# Patient Record
Sex: Male | Born: 1975 | Race: White | Hispanic: No | Marital: Single | Smoking: Former smoker
Health system: Southern US, Community
[De-identification: ages and names within clinical notes are randomized; demographics above are authoritative.]

## PROBLEM LIST (undated history)

## (undated) DIAGNOSIS — F32A Depression, unspecified: Secondary | ICD-10-CM

## (undated) DIAGNOSIS — T148XXA Other injury of unspecified body region, initial encounter: Secondary | ICD-10-CM

## (undated) DIAGNOSIS — Q780 Osteogenesis imperfecta: Secondary | ICD-10-CM

## (undated) DIAGNOSIS — F29 Unspecified psychosis not due to a substance or known physiological condition: Secondary | ICD-10-CM

## (undated) DIAGNOSIS — K279 Peptic ulcer, site unspecified, unspecified as acute or chronic, without hemorrhage or perforation: Secondary | ICD-10-CM

## (undated) DIAGNOSIS — K449 Diaphragmatic hernia without obstruction or gangrene: Secondary | ICD-10-CM

## (undated) DIAGNOSIS — D509 Iron deficiency anemia, unspecified: Secondary | ICD-10-CM

## (undated) DIAGNOSIS — I10 Essential (primary) hypertension: Secondary | ICD-10-CM

## (undated) DIAGNOSIS — K219 Gastro-esophageal reflux disease without esophagitis: Secondary | ICD-10-CM

## (undated) DIAGNOSIS — R152 Fecal urgency: Secondary | ICD-10-CM

## (undated) DIAGNOSIS — F845 Asperger's syndrome: Secondary | ICD-10-CM

## (undated) DIAGNOSIS — F329 Major depressive disorder, single episode, unspecified: Secondary | ICD-10-CM

## (undated) DIAGNOSIS — N39498 Other specified urinary incontinence: Secondary | ICD-10-CM

## (undated) DIAGNOSIS — R942 Abnormal results of pulmonary function studies: Secondary | ICD-10-CM

## (undated) HISTORY — DX: Fecal urgency: R15.2

## (undated) HISTORY — DX: Unspecified psychosis not due to a substance or known physiological condition: F29

## (undated) HISTORY — PX: DISTRACTION OSTEOGENESIS: SHX1473

## (undated) HISTORY — DX: Abnormal results of pulmonary function studies: R94.2

## (undated) HISTORY — DX: Diaphragmatic hernia without obstruction or gangrene: K44.9

## (undated) HISTORY — DX: Other injury of unspecified body region, initial encounter: T14.8XXA

## (undated) HISTORY — DX: Osteogenesis imperfecta: Q78.0

## (undated) HISTORY — DX: Depression, unspecified: F32.A

## (undated) HISTORY — DX: Gastro-esophageal reflux disease without esophagitis: K21.9

## (undated) HISTORY — DX: Asperger's syndrome: F84.5

## (undated) HISTORY — DX: Other specified urinary incontinence: N39.498

## (undated) HISTORY — DX: Peptic ulcer, site unspecified, unspecified as acute or chronic, without hemorrhage or perforation: K27.9

## (undated) HISTORY — DX: Major depressive disorder, single episode, unspecified: F32.9

## (undated) HISTORY — DX: Iron deficiency anemia, unspecified: D50.9

## (undated) HISTORY — DX: Essential (primary) hypertension: I10

---

## 2000-05-10 ENCOUNTER — Encounter: Payer: Self-pay | Admitting: Emergency Medicine

## 2000-05-10 ENCOUNTER — Emergency Department (HOSPITAL_COMMUNITY): Admission: EM | Admit: 2000-05-10 | Discharge: 2000-05-10 | Payer: Self-pay | Admitting: *Deleted

## 2000-06-09 ENCOUNTER — Emergency Department (HOSPITAL_COMMUNITY): Admission: EM | Admit: 2000-06-09 | Discharge: 2000-06-09 | Payer: Self-pay | Admitting: Emergency Medicine

## 2000-06-09 ENCOUNTER — Encounter: Payer: Self-pay | Admitting: Emergency Medicine

## 2000-08-06 ENCOUNTER — Emergency Department (HOSPITAL_COMMUNITY): Admission: EM | Admit: 2000-08-06 | Discharge: 2000-08-06 | Payer: Self-pay | Admitting: Emergency Medicine

## 2000-08-08 ENCOUNTER — Encounter: Payer: Self-pay | Admitting: Emergency Medicine

## 2000-08-08 ENCOUNTER — Emergency Department (HOSPITAL_COMMUNITY): Admission: EM | Admit: 2000-08-08 | Discharge: 2000-08-08 | Payer: Self-pay | Admitting: Emergency Medicine

## 2001-05-27 ENCOUNTER — Encounter: Payer: Self-pay | Admitting: Emergency Medicine

## 2001-05-27 ENCOUNTER — Emergency Department (HOSPITAL_COMMUNITY): Admission: EM | Admit: 2001-05-27 | Discharge: 2001-05-27 | Payer: Self-pay | Admitting: Emergency Medicine

## 2002-03-01 ENCOUNTER — Ambulatory Visit (HOSPITAL_COMMUNITY): Admission: RE | Admit: 2002-03-01 | Discharge: 2002-03-01 | Payer: Self-pay | Admitting: Internal Medicine

## 2002-03-01 ENCOUNTER — Encounter: Payer: Self-pay | Admitting: Internal Medicine

## 2002-06-13 ENCOUNTER — Encounter: Payer: Self-pay | Admitting: Specialist

## 2002-06-13 ENCOUNTER — Ambulatory Visit (HOSPITAL_COMMUNITY): Admission: RE | Admit: 2002-06-13 | Discharge: 2002-06-13 | Payer: Self-pay | Admitting: Specialist

## 2002-08-03 ENCOUNTER — Inpatient Hospital Stay (HOSPITAL_COMMUNITY): Admission: AD | Admit: 2002-08-03 | Discharge: 2002-08-07 | Payer: Self-pay | Admitting: Internal Medicine

## 2002-08-04 ENCOUNTER — Encounter: Payer: Self-pay | Admitting: Internal Medicine

## 2002-08-05 ENCOUNTER — Encounter: Payer: Self-pay | Admitting: *Deleted

## 2002-11-01 ENCOUNTER — Encounter: Admission: RE | Admit: 2002-11-01 | Discharge: 2002-11-01 | Payer: Self-pay | Admitting: Family Medicine

## 2002-11-29 ENCOUNTER — Encounter: Admission: RE | Admit: 2002-11-29 | Discharge: 2002-11-29 | Payer: Self-pay | Admitting: Family Medicine

## 2002-12-26 ENCOUNTER — Encounter: Admission: RE | Admit: 2002-12-26 | Discharge: 2002-12-26 | Payer: Self-pay | Admitting: Family Medicine

## 2003-01-23 ENCOUNTER — Encounter: Admission: RE | Admit: 2003-01-23 | Discharge: 2003-01-23 | Payer: Self-pay | Admitting: Family Medicine

## 2003-03-21 ENCOUNTER — Encounter: Payer: Self-pay | Admitting: Emergency Medicine

## 2003-03-21 ENCOUNTER — Emergency Department (HOSPITAL_COMMUNITY): Admission: EM | Admit: 2003-03-21 | Discharge: 2003-03-21 | Payer: Self-pay | Admitting: Emergency Medicine

## 2003-03-25 ENCOUNTER — Encounter: Admission: RE | Admit: 2003-03-25 | Discharge: 2003-03-25 | Payer: Self-pay | Admitting: Family Medicine

## 2003-10-18 ENCOUNTER — Encounter: Admission: RE | Admit: 2003-10-18 | Discharge: 2003-10-18 | Payer: Self-pay | Admitting: Sports Medicine

## 2003-11-01 ENCOUNTER — Inpatient Hospital Stay (HOSPITAL_COMMUNITY): Admission: EM | Admit: 2003-11-01 | Discharge: 2003-11-02 | Payer: Self-pay | Admitting: Emergency Medicine

## 2004-04-07 ENCOUNTER — Ambulatory Visit: Payer: Self-pay | Admitting: Family Medicine

## 2004-08-19 ENCOUNTER — Ambulatory Visit: Payer: Self-pay | Admitting: Family Medicine

## 2004-09-17 ENCOUNTER — Encounter: Admission: RE | Admit: 2004-09-17 | Discharge: 2004-09-17 | Payer: Self-pay | Admitting: Internal Medicine

## 2004-12-07 ENCOUNTER — Ambulatory Visit: Payer: Self-pay | Admitting: Family Medicine

## 2004-12-29 ENCOUNTER — Ambulatory Visit: Payer: Self-pay | Admitting: Sports Medicine

## 2005-01-18 ENCOUNTER — Ambulatory Visit: Payer: Self-pay | Admitting: Family Medicine

## 2005-07-16 ENCOUNTER — Ambulatory Visit: Payer: Self-pay | Admitting: Sports Medicine

## 2005-09-30 ENCOUNTER — Encounter: Admission: RE | Admit: 2005-09-30 | Discharge: 2005-12-29 | Payer: Self-pay | Admitting: Family Medicine

## 2005-10-29 ENCOUNTER — Ambulatory Visit: Payer: Self-pay | Admitting: Family Medicine

## 2005-11-04 ENCOUNTER — Ambulatory Visit: Payer: Self-pay | Admitting: Family Medicine

## 2006-02-28 ENCOUNTER — Ambulatory Visit: Payer: Self-pay | Admitting: Family Medicine

## 2006-04-08 ENCOUNTER — Ambulatory Visit: Payer: Self-pay | Admitting: Family Medicine

## 2006-07-22 ENCOUNTER — Ambulatory Visit: Payer: Self-pay | Admitting: Family Medicine

## 2006-08-05 ENCOUNTER — Ambulatory Visit: Payer: Self-pay | Admitting: Family Medicine

## 2006-08-05 ENCOUNTER — Encounter (INDEPENDENT_AMBULATORY_CARE_PROVIDER_SITE_OTHER): Payer: Self-pay | Admitting: Family Medicine

## 2006-08-05 LAB — CONVERTED CEMR LAB
ALT: 20 units/L (ref 0–53)
AST: 16 units/L (ref 0–37)
Alkaline Phosphatase: 103 units/L (ref 39–117)
Creatinine, Ser: 0.34 mg/dL — ABNORMAL LOW (ref 0.40–1.50)
Sodium: 141 meq/L (ref 135–145)
Total Bilirubin: 0.3 mg/dL (ref 0.3–1.2)
Total Protein: 7.2 g/dL (ref 6.0–8.3)

## 2006-08-19 ENCOUNTER — Ambulatory Visit: Payer: Self-pay | Admitting: Family Medicine

## 2006-09-01 DIAGNOSIS — I10 Essential (primary) hypertension: Secondary | ICD-10-CM

## 2006-09-01 DIAGNOSIS — L219 Seborrheic dermatitis, unspecified: Secondary | ICD-10-CM | POA: Insufficient documentation

## 2006-09-01 DIAGNOSIS — K279 Peptic ulcer, site unspecified, unspecified as acute or chronic, without hemorrhage or perforation: Secondary | ICD-10-CM

## 2006-09-01 DIAGNOSIS — J309 Allergic rhinitis, unspecified: Secondary | ICD-10-CM | POA: Insufficient documentation

## 2006-09-01 DIAGNOSIS — F339 Major depressive disorder, recurrent, unspecified: Secondary | ICD-10-CM | POA: Insufficient documentation

## 2006-09-01 DIAGNOSIS — F29 Unspecified psychosis not due to a substance or known physiological condition: Secondary | ICD-10-CM | POA: Insufficient documentation

## 2006-09-01 DIAGNOSIS — K219 Gastro-esophageal reflux disease without esophagitis: Secondary | ICD-10-CM | POA: Insufficient documentation

## 2006-09-01 HISTORY — DX: Allergic rhinitis, unspecified: J30.9

## 2006-09-08 ENCOUNTER — Ambulatory Visit: Payer: Self-pay | Admitting: Family Medicine

## 2006-09-08 ENCOUNTER — Telehealth (INDEPENDENT_AMBULATORY_CARE_PROVIDER_SITE_OTHER): Payer: Self-pay | Admitting: Family Medicine

## 2006-09-08 DIAGNOSIS — Q780 Osteogenesis imperfecta: Secondary | ICD-10-CM

## 2006-09-14 ENCOUNTER — Telehealth: Payer: Self-pay | Admitting: *Deleted

## 2006-09-15 ENCOUNTER — Ambulatory Visit: Payer: Self-pay | Admitting: Sports Medicine

## 2006-11-02 ENCOUNTER — Ambulatory Visit: Payer: Self-pay | Admitting: Family Medicine

## 2006-11-03 ENCOUNTER — Telehealth: Payer: Self-pay | Admitting: *Deleted

## 2006-11-04 ENCOUNTER — Ambulatory Visit: Payer: Self-pay | Admitting: Family Medicine

## 2006-11-04 ENCOUNTER — Encounter: Payer: Self-pay | Admitting: *Deleted

## 2006-11-08 ENCOUNTER — Telehealth (INDEPENDENT_AMBULATORY_CARE_PROVIDER_SITE_OTHER): Payer: Self-pay | Admitting: *Deleted

## 2006-11-10 ENCOUNTER — Telehealth (INDEPENDENT_AMBULATORY_CARE_PROVIDER_SITE_OTHER): Payer: Self-pay | Admitting: *Deleted

## 2007-01-27 ENCOUNTER — Telehealth: Payer: Self-pay | Admitting: *Deleted

## 2007-03-07 ENCOUNTER — Telehealth (INDEPENDENT_AMBULATORY_CARE_PROVIDER_SITE_OTHER): Payer: Self-pay | Admitting: Family Medicine

## 2007-03-16 ENCOUNTER — Ambulatory Visit: Payer: Self-pay | Admitting: Family Medicine

## 2007-03-16 DIAGNOSIS — H9190 Unspecified hearing loss, unspecified ear: Secondary | ICD-10-CM | POA: Insufficient documentation

## 2007-03-17 ENCOUNTER — Ambulatory Visit: Payer: Self-pay | Admitting: Family Medicine

## 2007-03-17 ENCOUNTER — Telehealth (INDEPENDENT_AMBULATORY_CARE_PROVIDER_SITE_OTHER): Payer: Self-pay | Admitting: Family Medicine

## 2007-03-23 ENCOUNTER — Telehealth: Payer: Self-pay | Admitting: *Deleted

## 2007-03-24 ENCOUNTER — Ambulatory Visit (HOSPITAL_COMMUNITY): Admission: RE | Admit: 2007-03-24 | Discharge: 2007-03-24 | Payer: Self-pay | Admitting: Family Medicine

## 2007-03-24 ENCOUNTER — Encounter (INDEPENDENT_AMBULATORY_CARE_PROVIDER_SITE_OTHER): Payer: Self-pay | Admitting: Family Medicine

## 2007-03-29 ENCOUNTER — Encounter (INDEPENDENT_AMBULATORY_CARE_PROVIDER_SITE_OTHER): Payer: Self-pay | Admitting: Family Medicine

## 2007-04-19 ENCOUNTER — Ambulatory Visit: Payer: Self-pay | Admitting: Family Medicine

## 2007-05-31 ENCOUNTER — Encounter (INDEPENDENT_AMBULATORY_CARE_PROVIDER_SITE_OTHER): Payer: Self-pay | Admitting: Family Medicine

## 2007-06-08 ENCOUNTER — Telehealth (INDEPENDENT_AMBULATORY_CARE_PROVIDER_SITE_OTHER): Payer: Self-pay | Admitting: Family Medicine

## 2007-06-13 ENCOUNTER — Encounter (INDEPENDENT_AMBULATORY_CARE_PROVIDER_SITE_OTHER): Payer: Self-pay | Admitting: Family Medicine

## 2007-06-13 ENCOUNTER — Ambulatory Visit: Payer: Self-pay | Admitting: Family Medicine

## 2007-06-13 LAB — CONVERTED CEMR LAB
ALT: 24 units/L (ref 0–53)
AST: 20 units/L (ref 0–37)
BUN: 16 mg/dL (ref 6–23)
Calcium: 9.6 mg/dL (ref 8.4–10.5)
Chloride: 104 meq/L (ref 96–112)
Creatinine, Ser: 0.4 mg/dL (ref 0.40–1.50)
HCT: 45.4 % (ref 39.0–52.0)
Hemoglobin: 14.7 g/dL (ref 13.0–17.0)
MCV: 86 fL (ref 78.0–100.0)
Platelets: 250 10*3/uL (ref 150–400)
RDW: 14.6 % (ref 11.5–15.5)
TSH: 1.292 microintl units/mL (ref 0.350–5.50)
Total Bilirubin: 0.3 mg/dL (ref 0.3–1.2)

## 2007-06-14 ENCOUNTER — Encounter: Admission: RE | Admit: 2007-06-14 | Discharge: 2007-06-14 | Payer: Self-pay | Admitting: Family Medicine

## 2007-06-16 ENCOUNTER — Telehealth (INDEPENDENT_AMBULATORY_CARE_PROVIDER_SITE_OTHER): Payer: Self-pay | Admitting: *Deleted

## 2007-06-16 DIAGNOSIS — I517 Cardiomegaly: Secondary | ICD-10-CM

## 2007-06-27 ENCOUNTER — Encounter (INDEPENDENT_AMBULATORY_CARE_PROVIDER_SITE_OTHER): Payer: Self-pay | Admitting: Dentistry

## 2007-06-27 ENCOUNTER — Ambulatory Visit (HOSPITAL_COMMUNITY): Admission: RE | Admit: 2007-06-27 | Discharge: 2007-06-27 | Payer: Self-pay | Admitting: Family Medicine

## 2007-07-03 ENCOUNTER — Ambulatory Visit: Payer: Self-pay | Admitting: Family Medicine

## 2007-07-03 DIAGNOSIS — N39498 Other specified urinary incontinence: Secondary | ICD-10-CM

## 2007-07-03 HISTORY — DX: Other specified urinary incontinence: N39.498

## 2007-07-03 LAB — CONVERTED CEMR LAB
Bilirubin Urine: NEGATIVE
Blood in Urine, dipstick: NEGATIVE
Ketones, urine, test strip: NEGATIVE
Nitrite: NEGATIVE
Specific Gravity, Urine: 1.01
Urobilinogen, UA: 0.2

## 2007-07-14 ENCOUNTER — Telehealth (INDEPENDENT_AMBULATORY_CARE_PROVIDER_SITE_OTHER): Payer: Self-pay | Admitting: Family Medicine

## 2007-07-18 ENCOUNTER — Telehealth: Payer: Self-pay | Admitting: *Deleted

## 2007-07-18 ENCOUNTER — Emergency Department (HOSPITAL_COMMUNITY): Admission: EM | Admit: 2007-07-18 | Discharge: 2007-07-18 | Payer: Self-pay | Admitting: Emergency Medicine

## 2007-09-15 ENCOUNTER — Ambulatory Visit: Payer: Self-pay | Admitting: Family Medicine

## 2007-09-15 DIAGNOSIS — L719 Rosacea, unspecified: Secondary | ICD-10-CM

## 2007-09-21 ENCOUNTER — Ambulatory Visit: Payer: Self-pay | Admitting: Family Medicine

## 2007-11-20 ENCOUNTER — Telehealth (INDEPENDENT_AMBULATORY_CARE_PROVIDER_SITE_OTHER): Payer: Self-pay | Admitting: Family Medicine

## 2008-03-28 ENCOUNTER — Ambulatory Visit: Payer: Self-pay | Admitting: Family Medicine

## 2008-03-28 LAB — CONVERTED CEMR LAB: Hemoglobin: 14.7 g/dL

## 2008-04-02 ENCOUNTER — Telehealth (INDEPENDENT_AMBULATORY_CARE_PROVIDER_SITE_OTHER): Payer: Self-pay | Admitting: Family Medicine

## 2008-04-05 ENCOUNTER — Ambulatory Visit: Payer: Self-pay | Admitting: Family Medicine

## 2008-06-10 ENCOUNTER — Telehealth: Payer: Self-pay | Admitting: *Deleted

## 2008-07-28 ENCOUNTER — Emergency Department (HOSPITAL_COMMUNITY): Admission: EM | Admit: 2008-07-28 | Discharge: 2008-07-28 | Payer: Self-pay | Admitting: Emergency Medicine

## 2008-07-29 ENCOUNTER — Encounter (INDEPENDENT_AMBULATORY_CARE_PROVIDER_SITE_OTHER): Payer: Self-pay | Admitting: Family Medicine

## 2009-01-01 ENCOUNTER — Encounter (INDEPENDENT_AMBULATORY_CARE_PROVIDER_SITE_OTHER): Payer: Self-pay | Admitting: Family Medicine

## 2009-03-04 ENCOUNTER — Telehealth: Payer: Self-pay | Admitting: Family Medicine

## 2009-03-12 ENCOUNTER — Ambulatory Visit: Payer: Self-pay | Admitting: Family Medicine

## 2009-03-21 ENCOUNTER — Encounter: Admission: RE | Admit: 2009-03-21 | Discharge: 2009-03-21 | Payer: Self-pay | Admitting: Family Medicine

## 2009-03-21 ENCOUNTER — Encounter: Payer: Self-pay | Admitting: Family Medicine

## 2009-03-21 ENCOUNTER — Ambulatory Visit: Payer: Self-pay | Admitting: Family Medicine

## 2009-03-21 LAB — CONVERTED CEMR LAB
ALT: 19 units/L (ref 0–53)
AST: 18 units/L (ref 0–37)
Albumin: 3.9 g/dL (ref 3.5–5.2)
CO2: 24 meq/L (ref 19–32)
Calcium: 8.4 mg/dL (ref 8.4–10.5)
Chloride: 106 meq/L (ref 96–112)
Cholesterol: 161 mg/dL (ref 0–200)
Platelets: 207 10*3/uL (ref 150–400)
Potassium: 4.3 meq/L (ref 3.5–5.3)
RDW: 14 % (ref 11.5–15.5)
Sodium: 141 meq/L (ref 135–145)
Total CHOL/HDL Ratio: 3.4
Total Protein: 6.8 g/dL (ref 6.0–8.3)
WBC: 6.4 10*3/uL (ref 4.0–10.5)

## 2009-03-24 ENCOUNTER — Telehealth: Payer: Self-pay | Admitting: Family Medicine

## 2009-03-24 ENCOUNTER — Encounter: Payer: Self-pay | Admitting: Family Medicine

## 2009-03-27 ENCOUNTER — Telehealth: Payer: Self-pay | Admitting: Family Medicine

## 2009-04-03 ENCOUNTER — Telehealth: Payer: Self-pay | Admitting: *Deleted

## 2009-05-16 ENCOUNTER — Telehealth: Payer: Self-pay | Admitting: Family Medicine

## 2009-07-07 ENCOUNTER — Telehealth: Payer: Self-pay | Admitting: Family Medicine

## 2009-07-29 ENCOUNTER — Ambulatory Visit: Payer: Self-pay | Admitting: Family Medicine

## 2009-08-15 ENCOUNTER — Ambulatory Visit: Payer: Self-pay | Admitting: Family Medicine

## 2009-08-25 ENCOUNTER — Telehealth: Payer: Self-pay | Admitting: Family Medicine

## 2010-04-02 ENCOUNTER — Encounter: Payer: Self-pay | Admitting: Family Medicine

## 2010-04-24 ENCOUNTER — Telehealth: Payer: Self-pay | Admitting: Family Medicine

## 2010-05-05 ENCOUNTER — Telehealth: Payer: Self-pay | Admitting: Family Medicine

## 2010-05-14 ENCOUNTER — Ambulatory Visit: Payer: Self-pay | Admitting: Family Medicine

## 2010-05-14 DIAGNOSIS — R152 Fecal urgency: Secondary | ICD-10-CM | POA: Insufficient documentation

## 2010-05-14 DIAGNOSIS — M25569 Pain in unspecified knee: Secondary | ICD-10-CM

## 2010-05-14 HISTORY — DX: Fecal urgency: R15.2

## 2010-05-22 ENCOUNTER — Encounter: Payer: Self-pay | Admitting: Family Medicine

## 2010-05-22 ENCOUNTER — Ambulatory Visit: Payer: Self-pay | Admitting: Family Medicine

## 2010-05-22 ENCOUNTER — Ambulatory Visit (HOSPITAL_COMMUNITY): Admission: RE | Admit: 2010-05-22 | Discharge: 2010-05-22 | Payer: Self-pay | Admitting: Family Medicine

## 2010-05-22 DIAGNOSIS — H543 Unqualified visual loss, both eyes: Secondary | ICD-10-CM | POA: Insufficient documentation

## 2010-05-22 LAB — CONVERTED CEMR LAB
Albumin: 4.4 g/dL (ref 3.5–5.2)
Alkaline Phosphatase: 93 units/L (ref 39–117)
CO2: 27 meq/L (ref 19–32)
Calcium: 9.2 mg/dL (ref 8.4–10.5)
Chloride: 105 meq/L (ref 96–112)
Glucose, Bld: 79 mg/dL (ref 70–99)
Potassium: 4.6 meq/L (ref 3.5–5.3)
Sodium: 142 meq/L (ref 135–145)
Total Protein: 7.6 g/dL (ref 6.0–8.3)

## 2010-05-27 ENCOUNTER — Encounter: Payer: Self-pay | Admitting: Family Medicine

## 2010-06-16 ENCOUNTER — Encounter: Payer: Self-pay | Admitting: Family Medicine

## 2010-07-02 ENCOUNTER — Telehealth: Payer: Self-pay | Admitting: Family Medicine

## 2010-07-22 ENCOUNTER — Telehealth: Payer: Self-pay | Admitting: Family Medicine

## 2010-07-29 ENCOUNTER — Ambulatory Visit: Admission: RE | Admit: 2010-07-29 | Discharge: 2010-07-29 | Payer: Self-pay | Source: Home / Self Care

## 2010-08-05 ENCOUNTER — Encounter: Payer: Self-pay | Admitting: Family Medicine

## 2010-08-06 NOTE — Progress Notes (Signed)
Summary: Rx Req  Phone Note Refill Request Call back at [phone] Message from:  Kathie Rhodes - Caretaker  Refills Requested: Medication #1:  METOPROLOL TARTRATE 25 MG  TABS Take one tablet two times a day for blood pressure  Medication #2:  ZYRTEC ALLERGY 10 MG TABS take one tablet daily. PT IS OUT AND NEEDS THEM AS SOON AS POSSIBLE.  [facility]  Initial call taken by: Clydell Hakim,  August 25, 2009 3:29 PM    Prescriptions: ZYRTEC ALLERGY 10 MG TABS (CETIRIZINE HCL) take one tablet daily  #30 x 11   Entered and Authorized by:   Delbert Harness MD   Signed by:   Delbert Harness MD on 08/26/2009   Method used:   Electronically to        [facility]       [address]       [place], [place]  [address]       Ph: [phone] or [phone]       Fax: [phone]   RxID:   [id number] METOPROLOL TARTRATE 25 MG  TABS (METOPROLOL TARTRATE) Take one tablet two times a day for blood pressure  #60 Tablet x 5   Entered and Authorized by:   Delbert Harness MD   Signed by:   Delbert Harness MD on 08/26/2009   Method used:   Electronically to        [facility] ([facility]       [address]       [place], [place]  [address]       Ph: [phone] or [phone]       Fax: [phone]   RxID:   [id number]

## 2010-08-06 NOTE — Assessment & Plan Note (Signed)
Summary: f/u/[facility]   Vital Signs:  Patient profile:   35 year old male Weight:      61 pounds BMI:     31.44 Temp:     97.3 degrees F oral Pulse rate:   97 / minute BP sitting:   126 / 88 Cuff size:   regular  Vitals Entered By: Jimmy Footman, CMA (May 14, 2010 9:24 AM) CC: cpe Is Patient Diabetic? No Pain Assessment Patient in pain? yes     Location: left knee   Primary Care Provider:  Delbert Harness MD  CC:  cpe.  History of Present Illness: 35 yo with OI here for follow-up  1.) left knee pain for several weeks or more.  no trauma  Not sever, more achig pain-not hurting currenlty.  More when active at the end of the day.  No stiffness, redness, effusion.  pain releived by tylenol.  Non-ambulatory- in a wheelchair  2.) fecal incontinence:  urge, not enough time to make it to the bathroom.  First thing in the morning- large amout of soft stool, not significantly different in consistncy or color..  No  leakage through the day.  usual habit is 2-3 times per day.  No change in dietary habits.  no abd pain, rectal pain, numbness, weakness or diarrhea.  has been occurring for several weeks.  His caregiver feels it is when he means to pass gas instead.  No constipation currently but had been on miralax in the past.  3.) OI:  no changes in vision or hearing although some hearing loss at baseline and wears glasses.  no bone pain  Habits & Providers  Alcohol-Tobacco-Diet     Tobacco Status: quit  Current Medications (verified): 1)  Fosamax 35 Mg  Tabs (Alendronate Sodium) .Marland Kitchen.. 1 Tab By Mouth Weekly.  Sit Upright 30 Minutes After Taking. 2)  Norvasc 5 Mg Tabs (Amlodipine Besylate) .... Take One Tablet Daily 3)  Flonase 50 Mcg/act  Susp (Fluticasone Propionate) .... One Spray Per Each Nostril Once A Day As Needed For Alergies. 4)  Prilosec 20 Mg  Cpdr (Omeprazole) .... One Tablet Once A Day- 5)  Multivitamin .... Take One Tablet By Mouth Daily 6)  Abilify 20 Mg Daily - Not Prescribed  By [place] .... Once Daily 7)  Metoprolol Tartrate 25 Mg  Tabs (Metoprolol Tartrate) .... Take One Tablet Two Times A Day For Blood Pressure 8)  Albuterol 90 Mcg/act  Aers (Albuterol) .... Take 1-2 Puffs Every 4 Hours As Needed For Wheezing 9)  Concerta 36 Mg Cr-Tabs (Methylphenidate Hcl) .... Once Daily  Prescribed By Dr. Marcelle Overlie. 10)  Celexa 20 Mg  Tabs (Citalopram Hydrobromide) .... Take One Tablet By Mouth Daily 11)  Medium Blood Pressure Cuff .... Please Provide Patient With Medium Blood Pressure Cuff For Life Source Machine He Uses or If Not Possible, Provide New Bp Monitor With Medium Cuff. Icd9 401.1 12)  Zyrtec Allergy 10 Mg Tabs (Cetirizine Hcl) .... Take One Tablet Daily  Allergies: 1)  ! Aspirin 2)  ! Nsaids PMH reviewed for relevance  Social History: Smoking Status:  quit  Review of Systems      See HPI  Physical Exam  General:  In wheelchair, small stature, Alert, NAD.  Very talkative, pleasant. Eyes:  No corneal or conjunctival inflammation noted. EOMI. Perrla.  Mouth:  Oral mucosa and oropharynx without lesions or exudates.   Neck:  supple, full ROM, and no masses.   Lungs:  normal respiratory effort, normal breath sounds,  no crackles, and no wheezes.   Heart:  normal rate, regular rhythm, no murmur, no gallop, and no rub.   Abdomen:  soft and non-tender.  normal bowel sounds, no distention, no masses, and abdominal scar(s).   Rectal:  No anal fissures, skin tags, hemorrhoids.  No stool present externally.  High resting rectal tone.  Normal sensation.  Unable to perform DRE due to patient discomfort. Msk:  profound skeletal deformities inclusding scoliosis, small stature, contractures of elbows and knees.  left knee, unable to fully extend, per caregiver and patient this is at baseline.  no effision, erythema or pain elicited on exam today.   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   As of today-pt has audiology appointment, will give flu vaccine.  Will plan on EKG, creatinine atvisit next week.  Continue bisphosphonate. ----------------------------------------------------------------------  Doing well with no clinical signs of fracture, bone pain, reswould feel more comfortable if Cindee Lame had a specialist he saw every year or two for monitoring.  Discussed paucity  options in the area, they eonly OI specialist is in are not willing to travel out of state at this time.  The may be in  only OI specialist is in [place] and they do not see adults.  I have researched the [facility] resources adn it appears the closest may  be in [place].  Given this limitation, will continue to get up to date on periodic surveillance screening.  - flu and pneumovax today  - will try to get records of DEXA, ocntinue bisphosphonate - Pt will follow-up for hearing screen with audiologist - pt to make vision screening appt - Cr and calcium at last visit WNL - Will schedule for spirometry. - Will need EKG/ECHO at some point in the future.  Orders: [facility]- Est  Level 4 ([address])  Problem # 2:  HYPERTENSION, BENIGN SYSTEMIC (ICD-401.1)  well controlled.  continue current management  His updated medication list for this problem includes:    Norvasc 5 Mg Tabs (Amlodipine besylate) .Marland Kitchen... Take one tablet daily    Metoprolol Tartrate 25 Mg Tabs (Metoprolol tartrate) .Marland Kitchen... Take one tablet two times a day for blood pressure  BP today: 126/88 Prior BP: 122/63 (08/15/2009)  Labs Reviewed: K+: 4.3 (03/21/2009) Creat: : 0.34 (03/21/2009)   Chol: 161 (03/21/2009)   HDL: 48 (03/21/2009)   LDL: 103 (03/21/2009)   TG: 49 (03/21/2009)  Orders: [facility]- Est  Level 4 ([address])  Problem # 3:  FECAL URGENCY (ICD-787.63)  No clear history of diarrhea  or infection, malabsoprtion, constipation, change in diet or stools.  Advised increasing fiber in diet and monitoring for triggers-  foods or behaviors.  Will follow-up in 1-2 weeks.  Given 2 stool carsd for hemoocult testing.  if no improvement, will obtain CMET and stool culture and fecal fat.  Conside GI referral.  Orders: [facility]- Est  Level 4 ([address])  Problem # 4:  KNEE PAIN, LEFT (ICD-719.46)  not currenlty hurting.  no evidence of bone pain or effusion on my exam today.  Tylenol prn, if continues would get xray to evaluate for changes.  Orders: [facility]- Est  Level 4 ([address])  Complete Medication List: 1)  Fosamax 35 Mg Tabs (Alendronate sodium) .Marland Kitchen.. 1 tab by mouth weekly.  sit upright 30 minutes after taking. 2)  Norvasc 5 Mg Tabs (Amlodipine besylate) .... Take one tablet daily 3)  Flonase 50  Mcg/act Susp (Fluticasone propionate) .... One spray per each nostril once a day as needed for alergies. 4)  Prilosec 20 Mg Cpdr (Omeprazole) .... One tablet once a day- 5)  Multivitamin  .... Take one tablet by mouth daily 6)  Abilify 20 Mg Daily - Not Prescribed By [place]  .... Once daily 7)  Metoprolol Tartrate 25 Mg Tabs (Metoprolol tartrate) .... Take one tablet two times a day for blood pressure 8)  Albuterol 90 Mcg/act Aers (Albuterol) .... Take 1-2 puffs every 4 hours as needed for wheezing 9)  Concerta 36 Mg Cr-tabs (Methylphenidate hcl) .... Once daily  prescribed by dr. Marcelle Overlie. 10)  Celexa 20 Mg Tabs (Citalopram hydrobromide) .... Take one tablet by mouth daily 11)  Medium Blood Pressure Cuff  .... Please provide patient with medium blood pressure cuff for life source machine he uses or if not possible, provide new bp monitor with medium cuff. icd9 401.1 12)  Zyrtec Allergy 10 Mg Tabs (Cetirizine hcl) .... Take one tablet daily  Other Orders: Flu Vaccine 31yrs + ([address]) Admin 1st Vaccine ([address])   Orders Added: 1)  Flu Vaccine 92yrs + [[address]] 2)  Admin 1st Vaccine [90471] 3)  [facility]- Est  Level 4 [09811]   Immunizations Administered:  Influenza Vaccine # 1:    Vaccine Type: Fluvax 3+    Site: right deltoid    Mfr:  GlaxoSmithKline    Dose: 0.5 ml    Route: IM    Given by: Jimmy Footman, CMA    Exp. Date: 01/02/2011    Lot #: [id number]    VIS given: 01/27/10 version given May 14, 2010.  Flu Vaccine Consent Questions:    Do you have a history of severe allergic reactions to this vaccine? no    Any prior history of allergic reactions to egg and/or gelatin? no    Do you have a sensitivity to the preservative Thimersol? no    Do you have a past history of Guillan-Barre Syndrome? no    Do you currently have an acute febrile illness? no    Have you ever had a severe reaction to latex? no    Vaccine information given and explained to patient? yes   Immunizations Administered:  Influenza Vaccine # 1:    Vaccine Type: Fluvax 3+    Site: right deltoid    Mfr: GlaxoSmithKline    Dose: 0.5 ml    Route: IM    Given by: Jimmy Footman, CMA    Exp. Date: 01/02/2011    Lot #: [id number]    VIS given: 01/27/10 version given May 14, 2010.   Prevention & Chronic Care Immunizations   Influenza vaccine: Fluvax 3+  (05/14/2010)   Influenza vaccine due: 03/28/2009    Tetanus booster: Not documented    Pneumococcal vaccine: Pneumovax (State)  (07/29/2009)  Other Screening   Smoking status: quit  (05/14/2010)  Hypertension   Last Blood Pressure: 126 / 88  (05/14/2010)   Serum creatinine: 0.34  (03/21/2009)   Serum potassium 4.3  (03/21/2009)    Hypertension flowsheet reviewed?: Yes   Progress toward BP goal: At goal  Self-Management Support :   Personal Goals (by the next clinic visit) :      Personal blood pressure goal: 140/90  (08/15/2009)   Hypertension self-management support: Not documented    Hypertension self-management support not done because: Good outcomes  (05/14/2010)

## 2010-08-06 NOTE — Progress Notes (Signed)
  Phone Note Refill Request   Refills Requested: Medication #1:  NORVASC 5 MG TABS take one tablet daily Need refill for meds  Initial call taken by: Abundio Miu,  July 22, 2010 9:10 AM    Prescriptions: NORVASC 5 MG TABS (AMLODIPINE BESYLATE) take one tablet daily  #30 x 10   Entered and Authorized by:   Delbert Harness MD   Signed by:   Delbert Harness MD on 07/22/2010   Method used:   Electronically to        [facility]       [address]       [place], [place]  [address]       Ph: [phone] or [phone]       Fax: [phone]   RxID:   [id number]

## 2010-08-06 NOTE — Consult Note (Signed)
Summary: [facility]: femur fracture  [facility]   Imported By: De Nurse 06/24/2010 11:28:49  _____________________________________________________________________  External Attachment:    Type:   Image     Comment:   External Document

## 2010-08-06 NOTE — Progress Notes (Signed)
Summary: REFILL  Phone Note Refill Request Call back at [phone] Message from:  Patient  Refills Requested: Medication #1:  METOPROLOL TARTRATE 25 MG  TABS Take one tablet two times a day for blood pressure Initial call taken by: De Nurse,  May 05, 2010 9:03 AM  Follow-up for Phone Call        i received e-refill from pharmacy.  pelase let patient know that I have refilled it and in the future he does not need to call- he can just call his pharmacy- this  is the best way. Follow-up by: Delbert Harness MD,  May 05, 2010 5:24 PM  Additional Follow-up for Phone Call Additional follow up Details #1::        the refill was for [facility] not the Metoprolol Additional Follow-up by: De Nurse,  May 06, 2010 9:57 AM    Additional Follow-up for Phone Call Additional follow up Details #2::    sorry about that.  Completed. Follow-up by: Delbert Harness MD,  May 07, 2010 8:54 PM  Prescriptions: METOPROLOL TARTRATE 25 MG  TABS (METOPROLOL TARTRATE) Take one tablet two times a day for blood pressure  #60 Tablet x 3   Entered and Authorized by:   Delbert Harness MD   Signed by:   Delbert Harness MD on 05/07/2010   Method used:   Electronically to        [facility]       [address]       [place], [place]  [address]       Ph: [phone] or [phone]       Fax: [phone]   RxID:   [id number]

## 2010-08-06 NOTE — Progress Notes (Signed)
Summary: Rx Req  Phone Note Refill Request Call back at [phone] Message from:  Kathie Rhodes  Refills Requested: Medication #1:  NORVASC 5 MG TABS take one tablet daily  Medication #2:  FOSAMAX 35 MG  TABS 1 tab by mouth weekly.  Sit upright 30 minutes after taking. Initial call taken by: Clydell Hakim,  April 24, 2010 11:24 AM  Follow-up for Phone Call        I already sent in foasamax several days ago.  Please let Ethelene Browns know that pete needs his flu shot. Follow-up by: Delbert Harness MD,  April 24, 2010 11:28 AM  Additional Follow-up for Phone Call Additional follow up Details #1::        Dr Earnest Bailey can you please [facility] in the [facility]amax [facility].  They do not use the other pharmacy anymore. Additional Follow-up by: Clydell Hakim,  April 24, 2010 4:37 PM    Prescriptions: FOSAMAX 35 MG  TABS (ALENDRONATE SODIUM) 1 tab by mouth weekly.  Sit upright 30 minutes after taking.  #4 Tablet x 3   Entered and Authorized by:   Delbert Harness MD   Signed by:   Delbert Harness MD on 04/27/2010   Method used:   Electronically to        [facility]       [address]       [place], [place]  [address]       Ph: [phone] or [phone]       Fax: [phone]   RxID:   [id number] NORVASC 5 MG TABS (AMLODIPINE BESYLATE) take one tablet daily  #30 x 2   Entered and Authorized by:   Delbert Harness MD   Signed by:   Delbert Harness MD on 04/24/2010   Method used:   Electronically to        [facility]       [address]       [place], [place]  [address]       Ph: [phone] or [phone]       Fax: [phone]   RxID:   [id number]

## 2010-08-06 NOTE — Assessment & Plan Note (Signed)
Summary: f/u OI, HTN,df   Vital Signs:  Patient profile:   35 year old male Weight:      60.3 pounds Temp:     97.8 degrees F oral Pulse rate:   80 / minute BP sitting:   114 / 83 Cuff size:   regular  Vitals Entered By: Loralee Pacas CMA (July 29, 2009 2:40 PM) Comments follow up of bp, and other tests    Primary Care Provider:  Delbert Harness MD   History of Present Illness: 35 yo with OI here today for follow up of HTN and to work on getting up to date on recommended screening.  Here today with his case Armed forces training and education officer.  patient is very compliant with medical regimen as he has help from assistant.  HYPERTENSION Meds: Taking and tolerating?yes Home BP's: no Chest Pain: no Dyspnea:: no  Has been doing very well on decreased blood pressure meds since losing weight.  OI:  Unclear what type he has.  Has not seen an OI specialist since childhood.  Per case manager who is familiar with physicians in the area, the closest specialist is in [place] and they are not willing to drive there for annual or every other year visits.  Has an orthopedist but has not had any problems or fractures in a while so has not seen recenlty for follow-up.  Has not been able to get screening done as prescribed at last appt.  DEXA: went to [facility] radiology who may or may not have completed test.  They said they were not sure how to do it for patients with OI. per case manager, the patient's sister has trouble obtaining DEXA even at [facility].  Audiology: Had ear examination but not hearing screening.  Has not had annual flu vaccine and does not remember ever having pneumonia vaccine.  Current Medications (verified): 1)  Fosamax 35 Mg  Tabs (Alendronate Sodium) .Marland Kitchen.. 1 Tab By Mouth Weekly.  Sit Upright 30 Minutes After Taking. 2)  Norvasc 5 Mg Tabs (Amlodipine Besylate) .... Take One Tablet Daily (Decreased From 10 Mg) 3)  Flonase 50 Mcg/act  Susp (Fluticasone Propionate) .... One  Spray Per Each Nostril Once A Day As Needed For Alergies. 4)  Cetirizine Hcl 10 Mg Tabs (Cetirizine Hcl) .... Take One Tablet Daily.  Use in Place of Allegra-D For Medicaid Required Trial. 5)  Prilosec 20 Mg  Cpdr (Omeprazole) .... One Tablet Two Times A Day- Needs Office Visit 6)  Multivitamin .... Take One Tablet By Mouth Daily 7)  Abilify 20 Mg Daily - Not Prescribed By [place] 8)  Colace 100 Mg  Caps (Docusate Sodium) .... Take One Tablet By Mouth Daily 9)  Metoprolol Tartrate 25 Mg  Tabs (Metoprolol Tartrate) .... Take One Tablet Two Times A Day For Blood Pressure 10)  Albuterol 90 Mcg/act  Aers (Albuterol) .... Take 1-2 Puffs Every 4 Hours As Needed For Wheezing 11)  Concerta 36 Mg Cr-Tabs (Methylphenidate Hcl) .... Prescribed By Dr. Marcelle Overlie. 12)  Celexa 20 Mg  Tabs (Citalopram Hydrobromide) .... Take One Tablet By Mouth Daily 13)  Nystatin 100000 Unit/gm  Crea (Nystatin) .... Apply Two Times A Day To Affected Area Behind Knee Until Lesion Clears. 30 Gram Tube 14)  Medium Blood Pressure Cuff .... Please Provide Patient With Medium Blood Pressure Cuff For Life Source Machine He Uses or If Not Possible, Provide New Bp Monitor With Medium Cuff. Icd9 401.1 15)  Zyrtec Allergy 10 Mg Tabs (Cetirizine Hcl) .... Take One  Tablet Daily  Allergies: 1)  ! Aspirin 2)  ! 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  Has seen Dr. Gerilyn Pilgrim in th epast. 9/08 PFTS suggest restricitive lung disease Hearing loss  Monitoring for osteogenesis imperfecta These tests include: DEXA every 1-2 years Hearing test every 2-3  years Test vision every 2-3 years [place] EKG PFTs every 1-2 years Monitoring kidney function to prevent kidney stones (Cr normal 2/08) Physical therapist for exercise No smoking, Maintaining healthy weight  Family History: Patient adopted at 35 years old.   Has a sister who also has OI.  Social History: lives at group home, disabled, in wheelchair, non ambulatory, no ETOH, smokes 1ppd quit 06/2004.; [facility] religion, enjoys computers; Guardian is mother [place] phone number [phone]. Plays war games on computer at home for fun.   Review of Systems      See HPI General:  Denies fever and weight loss. ENT:  Denies decreased hearing. CV:  Denies chest pain or discomfort, difficulty breathing at night, difficulty breathing while lying down, lightheadness, shortness of breath with exertion, and swelling of feet. Resp:  Denies cough, pleuritic, and shortness of breath. GI:  Denies abdominal pain and change in bowel habits. GU:  Denies dysuria and hematuria. MS:  Denies joint pain, low back pain, and muscle weakness.  Physical Exam  General:  In wheelchair, small stature, Alert, NAD.  Very talkative, pleasant.   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  Discussed paucity  options in the area, they eonly OI specialist is in are not willing to travel out of state at this time.  The may be in  only OI specialist is in [place] and they do not see adults.  I have researched the [facility] resources adn it appears the closest may  be in [place].  Given this limitation, will continue to get up to date on periodic surveillance screening.  - flu and pneumovax today  - will try to get records of DEXA, ocntinue bisphosphonate - Pt will follow-up for hearing screen with  audiologist - pt to make vision screening appt - Cr and calcium at last visit WNL - Will schedule for spirometry. - Will need EKG/ECHO at some point in the future.  Orders: [facility]- Est Level  3 (41324)  Problem # 2:  HYPERTENSION, BENIGN SYSTEMIC (ICD-401.1)  doing well on current regimen.  Will continue.  His updated medication list for this problem includes:    Norvasc 5 Mg Tabs (Amlodipine besylate) .Marland Kitchen... Take one tablet daily (decreased from 10 mg)    Metoprolol Tartrate 25 Mg Tabs (Metoprolol tartrate) .Marland Kitchen... Take one tablet two times a day for blood pressure  Orders: [facility]- Est Level  3 (40102)  Complete Medication List: 1)  Fosamax 35 Mg Tabs (Alendronate sodium) .Marland Kitchen.. 1 tab by mouth weekly.  sit upright 30 minutes after taking. 2)  Norvasc 5 Mg Tabs (Amlodipine besylate) .... Take one tablet daily (decreased from 10 mg)  3)  Flonase 50 Mcg/act Susp (Fluticasone propionate) .... One spray per each nostril once a day as needed for alergies. 4)  Cetirizine Hcl 10 Mg Tabs (Cetirizine hcl) .... Take one tablet daily.  use in place of allegra-d for medicaid required trial. 5)  Prilosec 20 Mg Cpdr (Omeprazole) .... One tablet two times a day- needs office visit 6)  Multivitamin  .... Take one tablet by mouth daily 7)  Abilify 20 Mg Daily - Not Prescribed By [place]  8)  Colace 100 Mg Caps (Docusate sodium) .... Take one tablet by mouth daily 9)  Metoprolol Tartrate 25 Mg Tabs (Metoprolol tartrate) .... Take one tablet two times a day for blood pressure 10)  Albuterol 90 Mcg/act Aers (Albuterol) .... Take 1-2 puffs every 4 hours as needed for wheezing 11)  Concerta 36 Mg Cr-tabs (Methylphenidate hcl) .... Prescribed by dr. Marcelle Overlie. 12)  Celexa 20 Mg Tabs (Citalopram hydrobromide) .... Take one tablet by mouth daily 13)  Nystatin 100000 Unit/gm Crea (Nystatin) .... Apply two times a day to affected area behind knee until lesion clears. 30 gram tube 14)  Medium Blood Pressure Cuff  .... Please  provide patient with medium blood pressure cuff for life source machine he uses or if not possible, provide new bp monitor with medium cuff. icd9 401.1 15)  Zyrtec Allergy 10 Mg Tabs (Cetirizine hcl) .... Take one tablet daily  Other Orders: State-Pneumococcal Vaccine [phone]) Admin 1st Vaccine (04540) Influenza Vaccine NON MCR (98119)   Immunizations Administered:  Pneumonia Vaccine:    Vaccine Type: Pneumovax (State)    Site: left deltoid    Mfr: Merck    Dose: 0.5 ml    Route: IM    Given by: Loralee Pacas CMA    Exp. Date: 06/14/2010    Lot #: [id number]    VIS given: 01/31/96 version given July 29, 2009.  Influenza Vaccine # 1:    Vaccine Type: Fluvax Non-MCR    Site: left deltoid    Mfr: GlaxoSmithKline    Dose: 0.5 ml    Route: IM    Given by: Loralee Pacas CMA    Exp. Date: 01/01/2010    Lot #: [id number]    VIS given: 08/10/82010  Flu Vaccine Consent Questions:    Do you have a history of severe allergic reactions to this vaccine? no    Any prior history of allergic reactions to egg and/or gelatin? no    Do you have a sensitivity to the preservative Thimersol? no    Do you have a past history of Guillan-Barre Syndrome? no    Do you currently have an acute febrile illness? no    Have you ever had a severe reaction to latex? no    Vaccine information given and explained to patient? yes   Pneumovax 07/29/2008    Prevention & Chronic Care Immunizations   Influenza vaccine: Fluvax Non-MCR  (07/29/2009)   Influenza vaccine due: 03/28/2009    Tetanus booster: Not documented    Pneumococcal vaccine: Pneumovax (State)  (07/29/2009)  Other Screening   Smoking status: quit > 6 months  (03/12/2009)  Hypertension   Last Blood Pressure: 114 / 83  (07/29/2009)   Serum creatinine: 0.34  (03/21/2009)   Serum potassium 4.3  (03/21/2009)    Hypertension flowsheet reviewed?: Yes   Progress toward BP goal: At goal  Self-Management Support :    Hypertension  self-management support: Not documented

## 2010-08-06 NOTE — Assessment & Plan Note (Signed)
Summary: Spirometry - Rx Clinic   Vital Signs:  Patient profile:   35 year old male Height:      37 inches Weight:      60 pounds BMI:     30.93 O2 Sat:      98 % on Room air BP sitting:   122 / 63  O2 Flow:  Room air  Primary Care Provider:  Delbert Harness MD   History of Present Illness: Patient arrives in good spirits - Accompanied by Kathie Rhodes his AFL provider.   Patient reports smoking 1ppd for two years in his lifetime.  Quit two years ago.  Patient comes in today with "COLD symptoms".  Short of breath when excited over the last few days.  He has also been out of Abilify for 1 week, so AFL provider states he has been a little paranoid.  He restarted Abilify today.  Complains of abdominal discomfort (3/10) unrelated to any trigger.  Patient is concerned about having anemia due to an ulcer.  He is having regular bowel movements.  Examined by Dr. Earnest Bailey who found no acute abnormalities.  Habits & Providers  Alcohol-Tobacco-Diet     Tobacco Status: quit > 6 months     Tobacco Counseling: to remain off tobacco products     Cigarette Packs/Day: 1.0     Year Started: 2006     Year Quit: 2008  Current Medications (verified): 1)  Fosamax 35 Mg  Tabs (Alendronate Sodium) .Marland Kitchen.. 1 Tab By Mouth Weekly.  Sit Upright 30 Minutes After Taking. 2)  Norvasc 5 Mg Tabs (Amlodipine Besylate) .... Take One Tablet Daily (Decreased From 10 Mg) 3)  Flonase 50 Mcg/act  Susp (Fluticasone Propionate) .... One Spray Per Each Nostril Once A Day As Needed For Alergies. 4)  Prilosec 20 Mg  Cpdr (Omeprazole) .... One Tablet Two Times A Day- Needs Office Visit 5)  Multivitamin .... Take One Tablet By Mouth Daily 6)  Abilify 20 Mg Daily - Not Prescribed By [place] .... Once Daily 7)  Metoprolol Tartrate 25 Mg  Tabs (Metoprolol Tartrate) .... Take One Tablet Two Times A Day For Blood Pressure 8)  Albuterol 90 Mcg/act  Aers (Albuterol) .... Take 1-2 Puffs Every 4 Hours As Needed For Wheezing 9)  Concerta 36 Mg  Cr-Tabs (Methylphenidate Hcl) .... Once Daily  Prescribed By Dr. Marcelle Overlie. 10)  Celexa 20 Mg  Tabs (Citalopram Hydrobromide) .... Take One Tablet By Mouth Daily 11)  Medium Blood Pressure Cuff .... Please Provide Patient With Medium Blood Pressure Cuff For Life Source Machine He Uses or If Not Possible, Provide New Bp Monitor With Medium Cuff. Icd9 401.1 12)  Zyrtec Allergy 10 Mg Tabs (Cetirizine Hcl) .... Take One Tablet Daily  Allergies (verified): 1)  ! Aspirin 2)  ! 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  Patient has been experiencing SOB over the last week when he gets excited, however he has also been out of Abilify, which he restarted today.  He has not been using Albuterol anytime in the recent past and has noted no other changes in breathing.  Patient is still not smoking.  Patient is motivated to remain a non-smoker because of his mother and AFL provider.  This will serve as his baseline PFTs for future evaluation.   Reviewed results of pulmonary function tests.  Pt verbalized understanding of results.  Written pt instructions provided.  F/U Rx Clinic Visit:    TTFFC:  60  minutes.  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 Leonie Green exam, patient may be experiencing abdominal discomfort from gas or bloating.  Return to clinic in 1 week if no improvement for further work-up.  May use Gas-X to relieve symptoms and Miralax if constipated in the meantime.  His updated medication list for this problem  includes:    Prilosec 20 Mg Cpdr (Omeprazole) ..... One tablet two times a day- needs office visit  Complete Medication List: 1)  Fosamax 35 Mg Tabs (Alendronate sodium) .Marland Kitchen.. 1 tab by mouth weekly.  sit upright 30 minutes after taking. 2)  Norvasc 5 Mg Tabs (Amlodipine besylate) .... Take one tablet daily (decreased from 10 mg) 3)  Flonase 50 Mcg/act Susp (Fluticasone propionate) .... One spray per each nostril once a day as needed for alergies. 4)  Prilosec 20 Mg Cpdr (Omeprazole) .... One tablet two times a day- needs office visit 5)  Multivitamin  .... Take one tablet by mouth daily 6)  Abilify 20 Mg Daily - Not Prescribed By [place]  .... Once daily 7)  Metoprolol Tartrate 25 Mg Tabs (Metoprolol tartrate) .... Take one tablet two times a day for blood pressure 8)  Albuterol 90 Mcg/act Aers (Albuterol) .... Take 1-2 puffs every 4 hours as needed for wheezing 9)  Concerta 36 Mg Cr-tabs (Methylphenidate hcl) .... Once daily  prescribed by dr. Marcelle Overlie. 10)  Celexa 20 Mg Tabs (Citalopram hydrobromide) .... Take one tablet by mouth daily 11)  Medium Blood Pressure Cuff  .... Please provide patient with medium blood pressure cuff for life source machine he uses or if not possible, provide new bp monitor with medium cuff. icd9 401.1 12)  Zyrtec Allergy 10 Mg Tabs (Cetirizine hcl) .... Take one tablet daily Prescriptions: CONCERTA 36 MG CR-TABS (METHYLPHENIDATE HCL) once daily  Prescribed by Dr. Marcelle Overlie.  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)    Dose: 2.5mg     Route: inhaled    Exp Date: 03/06/2011    Lot #: [id number]    Mfr: nephron    Patient tolerated medication without complications    Given by: Jone Baseman CMA (August 15, 2009 10:07 AM)  Orders Added: 1)  Albuterol Sulfate Sol 1mg  unit dose [[id number]] 2)  PFT Baseline-Pre/Post Bronchodiolator [PFT Baseline-Pre/Pos]   Pulmonary Function Test Date: 08/15/2009 Height (in.): 37 Gender: Male  Pre-Spirometry FVC    Value: 1.10 L/min  FEV1    Value: 0.94 L    FEV1/FVC  Value: 86 %  Pred: 92 %     % Pred: 96 % FEF 25-75  Value: 1.12 L/min   Pred: 0.19 L/min     % Pred: 691 %  Post-Spirometry FVC    Value: 1.16 L/min  FEV1    Value: 1.02 L    FEV1/FVC  Value: 88 %     Pred: 92 %     % Pred: 96 % FEF 25-75  Value: 1.31 L/min   Pred: 0.19 L/min     % Pred: 691 %  Comments: Good effort today.  Evaluation: normal  Recommendations: Continue albuterol as needed.  Prevention & Chronic Care Immunizations   Influenza vaccine: Fluvax Non-MCR  (07/29/2009)   Influenza vaccine due: 03/28/2009    Tetanus booster: Not documented    Pneumococcal vaccine: Pneumovax (State)  (07/29/2009)  Other Screening   Smoking status: quit > 6 months  (08/15/2009)  Hypertension   Last Blood Pressure: 122 / 63  (08/15/2009)   Serum creatinine: 0.34  (03/21/2009)   Serum potassium 4.3  (03/21/2009)    Hypertension flowsheet reviewed?: Yes   Progress toward BP goal: At goal  Self-Management Support :   Personal Goals (by the next clinic visit) :      Personal blood pressure goal: 140/90  (08/15/2009)   Hypertension self-management support: Not documented

## 2010-08-06 NOTE — Assessment & Plan Note (Signed)
Summary: f/u/KH   Vital Signs:  Patient profile:   35 year old male Weight:      58.9 pounds Pulse rate:   81 / minute BP sitting:   127 / 87  Vitals Entered By: Tessie Fass CMA (May 22, 2010 9:39 AM)  Vision Screening:Left eye w/o correction: 20 / 120 Right Eye w/o correction: 20 / 120 Both eyes w/o correction:  20/ 120        Vision Entered By: Arlyss Repress CMA, (May 22, 2010 9:57 AM)   Primary Care Provider:  Delbert Harness MD   History of Present Illness: 35 yo with OI here for follow-up  anal leakage:  has decreased ni frequency.  Caregiver feels it only happens first thing in the morning because he is sleepy and inadvertently passes stool with flatulence.  This is not an issue any other time of day.  vision problem:  increasing blurry vision.  Does not wear his glasses.  Has not been for an eye exam in sevral years.  Current Medications (verified): 1)  Fosamax 35 Mg  Tabs (Alendronate Sodium) .Marland Kitchen.. 1 Tab By Mouth Weekly.  Sit Upright 30 Minutes After Taking. 2)  Norvasc 5 Mg Tabs (Amlodipine Besylate) .... Take One Tablet Daily 3)  Flonase 50 Mcg/act  Susp (Fluticasone Propionate) .... One Spray Per Each Nostril Once A Day As Needed For Alergies. 4)  Prilosec 20 Mg  Cpdr (Omeprazole) .... One Tablet Once A Day- 5)  Multivitamin .... Take One Tablet By Mouth Daily 6)  Abilify 20 Mg Daily - Not Prescribed By [place] .... Once Daily 7)  Metoprolol Tartrate 25 Mg  Tabs (Metoprolol Tartrate) .... Take One Tablet Two Times A Day For Blood Pressure 8)  Albuterol 90 Mcg/act  Aers (Albuterol) .... Take 1-2 Puffs Every 4 Hours As Needed For Wheezing 9)  Concerta 36 Mg Cr-Tabs (Methylphenidate Hcl) .... Once Daily  Prescribed By Dr. Marcelle Overlie. 10)  Celexa 20 Mg  Tabs (Citalopram Hydrobromide) .... Take One Tablet By Mouth Daily 11)  Medium Blood Pressure Cuff .... Please Provide Patient With Medium Blood Pressure Cuff For Life Source Machine He Uses or If Not Possible, Provide  New Bp Monitor With Medium Cuff. Icd9 401.1 12)  Zyrtec Allergy 10 Mg Tabs (Cetirizine Hcl) .... Take One Tablet Daily  Allergies: 1)  ! Aspirin 2)  ! 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  Has seen Dr. Gerilyn Pilgrim in th epast. 9/08 PFTS suggest restricitive lung disease Hearing loss [facility]almology- [facility]  Monitoring for osteogenesis imperfecta These tests include: DEXA every 1-2 years Hearing test every 2-3 years Test vision every 2-3 years [facility] EKG PFTs every 1-2 years Monitoring kidney function to prevent kidney stones (Cr normal 2/08) Physical therapist for exercise No smoking, Maintaining healthy weight PMH-FH-SH reviewed for relevance  Review of Systems      See HPI  Physical Exam  General:  In wheelchair, small stature, Alert, NAD.  Very talkative, pleasant. Lungs:  normal respiratory effort, normal breath sounds, no crackles, and no wheezes.   Heart:  normal rate, regular rhythm, no murmur, no gallop, and no rub.  Abdomen:  soft and non-tender.  normal bowel sounds, no distention Additional Exam:  EKG:  NSR, no hypertrpohy.   Impression & Recommendations:  Problem # 1:  FECAL URGENCY (ICD-787.63)  symptoms do not suggest neurological or muscular sphincter disorder.  Not distressing to patient.  He does not want any further workup and I think this is reasonable at this time.     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  In addition, importan for regular eye exams in OI.   Will refer to ophthalmology. 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  Would like to get ECHO at some point for surveillance of valves  and aortic root dilatation  Orders: EKG- [facility] (EKG) Comp Met-[facility] ([id number]) [facility]- Est  Level 4 (87564)  Complete Medication List: 1)  Fosamax 35 Mg Tabs (Alendronate sodium) .Marland Kitchen.. 1 tab by mouth weekly.  sit upright 30 minutes after taking. 2)  Norvasc 5 Mg Tabs (Amlodipine besylate) .... Take one tablet daily 3)  Flonase 50 Mcg/act Susp (Fluticasone propionate) .... One spray per each nostril once a day as needed for alergies. 4)  Prilosec 20 Mg Cpdr (Omeprazole) .... One tablet once a day- 5)  Multivitamin  .... Take one tablet by mouth daily 6)  Abilify 20 Mg Daily - Not Prescribed By [place]  .... Once daily 7)  Metoprolol Tartrate 25 Mg Tabs (Metoprolol tartrate) .... Take one tablet two times a day for blood pressure 8)  Albuterol 90 Mcg/act Aers (Albuterol) .... Take 1-2 puffs every 4 hours as needed for wheezing 9)  Concerta 36 Mg Cr-tabs (Methylphenidate hcl) .... Once daily  prescribed by dr. Marcelle Overlie. 10)  Celexa 20 Mg Tabs (Citalopram hydrobromide) .... Take one tablet by mouth daily 11)  Medium Blood Pressure Cuff  .... Please provide patient with medium blood pressure cuff for life source machine he uses or if not possible, provide new bp monitor with medium cuff. icd9 401.1 12)  Zyrtec Allergy 10 Mg Tabs (Cetirizine hcl) .... Take one tablet daily  Patient Instructions: 1)  No changes in medications today 2)  Will refer you to Dr. Emily Filbert for eye exam 3)  Follow-up in 6 months 4)  Have a great holiday season!   Orders Added: 1)  EKG- [facility] [EKG] 2)  Comp Met-[facility] [[id number]] 3)  Vision- [facility] [[id number]] 4)  Ophthalmology Referral [Ophthalmology] 5)  [facility]- Est  Level 4 [01601]

## 2010-08-06 NOTE — Miscellaneous (Signed)
  Clinical Lists Changes  Received notice from pharmacy regarding interaction between omeprazole 20 mg bid and citalopram 20mg .    WIll attempt trial of omeprazole once a day and if has symptoms, will discuss further with patient. Delbert Harness MD  April 02, 2010 9:16 AM   Medications: Changed medication from PRILOSEC 20 MG  CPDR (OMEPRAZOLE) one tablet two times a day- needs office visit to PRILOSEC 20 MG  CPDR (OMEPRAZOLE) one tablet once a day- - Signed Rx of PRILOSEC 20 MG  CPDR (OMEPRAZOLE) one tablet once a day-;  #30 x 5;  Signed;  Entered by: Delbert Harness MD;  Authorized by: Delbert Harness MD;  Method used: Electronically to [id number] [address] [place], [place]  [address], Ph: [phone] or [phone], Fax: [phone]    Prescriptions: PRILOSEC 20 MG  CPDR (OMEPRAZOLE) one tablet once a day-  #30 x 5   Entered and Authorized by:   Delbert Harness MD   Signed by:   Delbert Harness MD on 04/02/2010   Method used:   Electronically to        [id number] (retail)       [address]       [place], [place]  [address]       Ph: [phone] or [phone]       Fax: [phone]   RxID:   [id number]

## 2010-08-06 NOTE — Progress Notes (Signed)
Summary: refill: norvasc  Phone Note Refill Request Call back at [phone] Message from:  Davina Poke Requested: Medication #1:  NORVASC 5 MG TABS take one tablet daily (decreased from 10 mg) [facility] [address]  Next Appointment Scheduled: 07/29/09 Initial call taken by: De Nurse,  July 07, 2009 4:40 PM  Follow-up for Phone Call        to pcp Follow-up by: Golden Circle RN,  July 07, 2009 4:50 PM    Prescriptions: NORVASC 5 MG TABS (AMLODIPINE BESYLATE) take one tablet daily (decreased from 10 mg)  #34 x 2   Entered and Authorized by:   Delbert Harness MD   Signed by:   Delbert Harness MD on 07/07/2009   Method used:   Electronically to        [id number] (retail)       [address]       [place], [place]  [address]       Ph: [phone] or [phone]       Fax: [phone]   RxID:   [id number]

## 2010-08-06 NOTE — Assessment & Plan Note (Signed)
Summary: bowel problems,df   Vital Signs:  Patient profile:   35 year old male Weight:      62.5 pounds BMI:     32.21 Pulse rate:   108 / minute BP sitting:   133 / 91 Cuff size:   regular  Vitals Entered By: Jimmy Footman, CMA (July 29, 2010 10:32 AM) CC: stool issues, GI referral   Primary Care Provider:  Delbert Harness MD  CC:  stool issues and GI referral.  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  "fecal leakage: 2-3 months urge, not enough time to make it to the bathroom.  First thing in the morning- large amout of soft stool, not significantly different in consistncy or color..  No  leakage through the day.  usual habit is 2-3 times per day.  No change in dietary habits.  no abd pain, rectal pain, numbness, weakness or diarrhea.  has been occurring for several weeks.  His caregiver feels it is when he means to pass gas instead.  No constipation currently but had been on miralax in the past"  Continues despite no change in what is a usually high fiber diet.  Can vary from leakage to "blowout" per caregiver.  no abd pain, rectal bleeding or discomfort.  Did not return stool cards.  Current Medications (verified): 1)  Fosamax 35 Mg  Tabs (Alendronate Sodium) .Marland Kitchen.. 1 Tab By Mouth Weekly.  Sit Upright 30 Minutes After Taking. 2)  Norvasc 5 Mg Tabs (Amlodipine Besylate) .... Take One Tablet Daily 3)  Flonase 50 Mcg/act  Susp (Fluticasone Propionate) .... One Spray Per Each Nostril Once A Day As Needed For Alergies. 4)  Prilosec 20 Mg  Cpdr (Omeprazole) .... One Tablet Once A Day- 5)  Multivitamin .... Take One Tablet By Mouth Daily 6)  Abilify 20 Mg Daily - Not Prescribed By [place] .... Once Daily 7)  Metoprolol Tartrate 25 Mg  Tabs (Metoprolol Tartrate) .... Take One Tablet Two Times A Day For Blood Pressure 8)  Albuterol 90 Mcg/act  Aers (Albuterol) .... Take 1-2  Puffs Every 4 Hours As Needed For Wheezing 9)  Concerta 36 Mg Cr-Tabs (Methylphenidate Hcl) .... Once Daily  Prescribed By Dr. Marcelle Overlie. 10)  Celexa 20 Mg  Tabs (Citalopram Hydrobromide) .... Take One Tablet By Mouth Daily 11)  Medium Blood Pressure Cuff .... Please Provide Patient With Medium Blood Pressure Cuff For Life Source Machine He Uses or If Not Possible, Provide New Bp Monitor With Medium Cuff. Icd9 401.1 12)  Zyrtec Allergy 10 Mg Tabs (Cetirizine Hcl) .... Take One Tablet Daily  Allergies: 1)  ! Aspirin 2)  ! 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  Has seen Dr. Gerilyn Pilgrim in th epast. 9/08 PFTS suggest restricitive lung disease Hearing loss Ophtalmology- [facility]  Monitoring for osteogenesis imperfecta These tests include: DEXA every 1-2 years Hearing test every 2-3 years Test vision every 2-3 years [place] EKG PFTs every 1-2 years Monitoring kidney function to prevent kidney stones (Cr normal 2/08) Physical therapist for  exercise No smoking, Maintaining healthy weight  Past Surgical History: Last updated: 09/08/2006 Has had 22 previous sugeries for osteogensis imperfecta per patient - rods placed  Family History: Last updated: 07/29/2009 Patient adopted at 35 years old.   Has a sister who also has OI.  Social History: Last updated: 07/29/2009 lives at group home, disabled, in wheelchair, non ambulatory, no ETOH, smokes 1ppd quit 06/2004.; [facility] religion, enjoys computers; Guardian is mother Volin phone number [phone]. Plays war games on  computer at home for fun.  PMH-FH-SH reviewed for relevance  Review of Systems      See HPI  Physical Exam  General:  In wheelchair, small stature, Alert, NAD.  Very talkative, pleasant. Abdomen:  soft and non-tender.  normal bowel sounds, no distention Rectal:  prior visit reviewed :"No anal fissures, skin tags, hemorrhoids.  No stool present externally.  High resting rectal tone.  Normal sensation.  Unable to perform DRE due to patient discomfort."   Impression & Recommendations:  Problem # 1:  [facility] (AVW-098.11) Will refer to GI for further evaluation.  Orders: Gastroenterology Referral (GI) [facility]- Est Level  3 ([address]78)  Complete Medication List: 1)  Fosamax 35 Mg Tabs (Alendronate sodium) .Marland Kitchen.. 1 tab by mouth weekly.  sit upright 30 minutes after taking. 2)  Norvasc 5 Mg Tabs (Amlodipine besylate) .... Take one tablet daily 3)  Flonase 50 Mcg/act Susp (Fluticasone propionate) .... One spray per each nostril once a day as needed for alergies. 4)  Prilosec 20 Mg Cpdr (Omeprazole) .... One tablet once a day- 5)  Multivitamin  .... Take one tablet by mouth daily 6)  Abilify 20 Mg Daily - Not Prescribed By [place]  .... Once daily 7)  Metoprolol Tartrate 25 Mg Tabs (Metoprolol tartrate) .... Take one tablet two times a day for blood pressure 8)  Albuterol 90 Mcg/act Aers (Albuterol) .... Take 1-2 puffs every 4 hours as needed for wheezing 9)  Concerta 36 Mg Cr-tabs (Methylphenidate hcl) .... Once daily  prescribed by dr. Marcelle Overlie. 10)  Celexa 20 Mg Tabs (Citalopram hydrobromide) .... Take one tablet by mouth daily 11)  Medium Blood Pressure Cuff  .... Please provide patient with medium blood pressure cuff for life source machine he uses or if not possible, provide new bp monitor with medium cuff. icd9 401.1 12)  Zyrtec Allergy 10 Mg Tabs (Cetirizine hcl) .... Take one tablet daily   Orders Added: 1)  Gastroenterology Referral [GI] 2)  [facility]- Est Level  3 [[address]]

## 2010-08-06 NOTE — Progress Notes (Signed)
Summary: Rx  Phone Note Refill Request Call back at Home Phone [phone]   Refills Requested: Medication #1:  METOPROLOL TARTRATE 25 MG  TABS Take one tablet two times a day for blood pressure Initial call taken by: Knox Royalty,  July 02, 2010 9:02 AM  Follow-up for Phone Call        will forward to Dr. Jeanice Lim in Dr. Leonie Green absence. Follow-up by: Theresia Lo RN,  July 02, 2010 9:05 AM    Prescriptions: METOPROLOL TARTRATE 25 MG  TABS (METOPROLOL TARTRATE) Take one tablet two times a day for blood pressure  #60 Tablet x 6   Entered and Authorized by:   Milinda Antis MD   Signed by:   Milinda Antis MD on 07/02/2010   Method used:   Electronically to        [facility] ([facility])       [address]       [place], [place]  [address]       Ph: [phone] or [phone]       Fax: [phone]   RxID:   [id number]

## 2010-08-20 NOTE — Consult Note (Signed)
[facility]   Imported By: De Nurse 08/07/2010 12:38:54  _____________________________________________________________________  External Attachment:    Type:   Image     Comment:   External Document

## 2010-08-27 ENCOUNTER — Telehealth: Payer: Self-pay | Admitting: Family Medicine

## 2010-08-27 DIAGNOSIS — R269 Unspecified abnormalities of gait and mobility: Secondary | ICD-10-CM | POA: Insufficient documentation

## 2010-08-27 NOTE — Telephone Encounter (Signed)
Kathie Rhodes, Nena Jordan' caregiver calling to request a  rx for another power wheelchair.  Medicaid will pay for it, but he have to have a order from primary.  Please call Ethelene Browns when rx is ready to pickup

## 2010-08-27 NOTE — Telephone Encounter (Signed)
Completed and called Stanley [name] to pick up.  He states he will  Be here in a few minutes.

## 2010-09-14 ENCOUNTER — Other Ambulatory Visit: Payer: Self-pay | Admitting: Family Medicine

## 2010-09-14 NOTE — Telephone Encounter (Signed)
Refill request-Dr. Briscoe on vacation 

## 2010-09-14 NOTE — Telephone Encounter (Signed)
Refill request

## 2010-09-15 ENCOUNTER — Other Ambulatory Visit: Payer: Self-pay | Admitting: Family Medicine

## 2010-09-15 MED ORDER — CETIRIZINE HCL 10 MG PO TABS: 10.0000 mg | ORAL_TABLET | Freq: Every day | ORAL | Status: DC

## 2010-11-04 ENCOUNTER — Other Ambulatory Visit: Payer: Self-pay | Admitting: Family Medicine

## 2010-11-05 NOTE — Telephone Encounter (Signed)
Refill request

## 2010-11-20 NOTE — H&P (Signed)
Stanley [name], Stanley [name]                       ACCOUNT NO.:  [id number]   MEDICAL RECORD NO.:  [id number]                   PATIENT TYPE:  INP   LOCATION:  0346                                 FACILITY:  [facility]   PHYSICIAN:  Merlene Laughter. Renae Gloss, M.D.           DATE OF BIRTH:  1975/07/24   DATE OF ADMISSION:  11/01/2003  DATE OF DISCHARGE:                                HISTORY & PHYSICAL   CHIEF COMPLAINT:  Blood loss anemia.   HISTORY OF PRESENT ILLNESS:  Stanley [name] is a 35 year old gentleman who  presents with a panic hemoglobin of 5 that was obtained in his physician's  office earlier today.  He denies shortness of breath, chest pain, and has  been in his usual state of health.  He is admitted for blood transfusion and  further evaluation of acute anemia.   ALLERGIES:  No known drug allergies.   MEDICATIONS:  1. Nexium 25 mg p.o. daily.  2. Celexa 60 mg p.o. daily.  3. Hemocyte Plus p.o. daily.  4. Abilify 5 mg p.o. daily.   PAST MEDICAL HISTORY:  1. Osteogenesis imperfecta.  2. Depression.  3. Gastritis/GERD.   FAMILY HISTORY:  The patient is adopted.   SOCIAL HISTORY:  The patient has a caregiver and lives independently.  He  denies tobacco, alcohol, or drug abuse.  He is disabled as a result of his  osteogenesis imperfecta.   REVIEW OF SYSTEMS:  As per history of present illness and patient history  assessment.  Greater than 10 systems were reviewed.   PHYSICAL EXAMINATION:  GENERAL APPEARANCE:  A well-developed, well-nourished  male in no acute distress.  VITAL SIGNS:  Blood pressure 101/44, heart rate 133, respirations 20, O2  saturations on room air 97%, temperature 98.8.  HEENT:  TM's within normal limits.  No oropharyngeal lesions.  NECK:  Supple.  No masses.  There were 2+ carotids.  No bruits.  LUNGS:  Clear to auscultation bilaterally.  HEART:  S1 and S2, regular rate and rhythm.  No murmurs, rubs, or gallops.  ABDOMEN:  Soft, nondistended, nontender.   Positive bowel sounds.  EXTREMITIES:  Contractures in all the extremities.  NEUROLOGIC:  Alert and oriented x3.  Cranial nerves intact.   ASSESSMENT AND PLAN:  Acute anemia with hemoglobin of 5.4.  There has been  no acute bleeding.  Stanley [name] does have a significant history of  gastritis/peptic ulcer disease.  This may be contributing to his acute  symptoms.  His father states that his hemoglobin was obtained about a month  ago and was entirely normal.  He will be admitted for transfusion.  Further  anemia work-up will be as per his primary care physician, Dr. Ludwig Clarks.  Merlene Laughter. Renae Gloss, M.D.    KRS/MEDQ  D:  11/01/2003  T:  11/01/2003  Job:  [id number]

## 2010-11-20 NOTE — Discharge Summary (Signed)
NAMEAKIF, WELDY                       ACCOUNT NO.:  [id number]   MEDICAL RECORD NO.:  [id number]                   PATIENT TYPE:  INP   LOCATION:  5015                                 FACILITY:  [facility]   PHYSICIAN:  Ralene Ok, M.D.                   DATE OF BIRTH:  [birth year]   DATE OF ADMISSION:  08/03/2002  DATE OF DISCHARGE:                                 DISCHARGE SUMMARY   This 35 year old male was admitted with severe anemia with hemoglobin on  two occasions showing critical values at 2.2 and 2.3.  Was transfused with  four units of packed red blood cells and subsequent hemoglobin showed  elevation of 13.4.  The patient also was noted to be pyrexial during this  admission and auscultation of his chest showed crepitations mainly in the  right base.  However, chest x-ray did not confirm pneumonia.  The patient  did have mild cough which seemed to subside when starting the patient on  antibiotics.  His other significant medical history is that of positive  osteogenesis imperfecta for which the patient has had multiple surgeries of  the lower limbs and kyphoscoliosis with height of about under two feet.  The  patient had been on Celexa, Risperdal, Allegra and multivitamins with iron.  He tolerated the IV blood transfusion without any significant problems.  A  calcium level was decreased at 6.8 with an albumin of 2.7 and repeat calcium  and  CBC prior to patient's discharge.  He also developed  hypokalemia  requiring potassium supplements.  His liver function tests were normal.  Serum ferratin confirms significant iron deficiency with a ferratin of only  6.  Stool for occult blood interestingly was negative.  An upper GI series  done after GI consult with Dr. Luther Parody as patient  refused EGD showed  hiatal hernia with thickened gastric folds.  No definite ulcers were noted.  Full report is still pending.   DISCHARGE DIAGNOSES:  1. Anemia due to chronic blood loss, iron  deficiency type.  2. Chest infection, bronchitis resolving.  3. Hypokalemia, resolving.  4. Osteogenesis imperfecta with significant disability.  5. History of psychosis.   PLAN:  The patient will be discharged home on 08/07/02 on Nexium 40 mg by  mouth daily.  The patient will be continued on potassium chloride 20 mEq  daily for another week.  He will be continued on Celexa 60 mg daily,  Risperdal 0.5 mg daily, calcium and vitamin D 500, Os-Cal Plus D one tablet  twice a day, Hemocyte Plus one tablet daily and Tequin 200 mg p.o. daily for  another three days. The patient will be followed up in the outpatient  setting and will need a complete metabolic panel and CBC in about 10 days  time.  The patient advised to promptly report any blackish-colored stools or  brownish vomiting earlier in future.  Ralene Ok, M.D.    RM/MEDQ  D:  08/06/2002  T:  08/07/2002  Job:  [id number]

## 2010-11-20 NOTE — Consult Note (Signed)
Stanley [name], Stanley [name]                       ACCOUNT NO.:  [id number]   MEDICAL RECORD NO.:  [id number]                   PATIENT TYPE:  INP   LOCATION:  5015                                 FACILITY:  [facility]   PHYSICIAN:  Althea Grimmer. Luther Parody, M.D.            DATE OF BIRTH:  Nov 14, 1975   DATE OF CONSULTATION:  08/04/2002  DATE OF DISCHARGE:                                   CONSULTATION   HISTORY OF PRESENT ILLNESS:  The patient is a 35 year old male who I am  asked to see for profound anemia with epigastric pain and history of coffee  ground emesis.  Apparently, he has been having epigastric pain for about  three weeks with a few episodes of coffee ground emesis.  He has had no  melena.  He presented feeling somewhat weak and short of breath and was  found to have hemoglobin of 2.2 with a MCV of 69.3.  He has a history of  ulcer disease dating back to his teenage years and has been treated  chronically with Prilosec, recently switched to Nexium.  X-rays in the past  have also reportedly shown a large hiatal hernia with air fluid levels  present.  The patient has underlying osteogenous imperfecta with multiple  skeletal abnormalities and very short stature.  He denies taking any aspirin  or nonsteroidals for pain.  He apparently has never been treated for  Helicobacter infection and there is no report of that being diagnosed.   He has heartburn but he says as long as he stays on his Nexium it is fairly  asymptomatic.  In addition, he takes Tums for the calcium.   PAST MEDICAL HISTORY:  Pertinent for osteogenous imperfecta and reported  psychotic depression.   CURRENT MEDICATIONS:  1. Celexa 60 mg q.d.  2. Risperdal 0.5 mg q.d.  3. Protonix currently being given IV.  4. He has received Lasix for each of the four units that has been     transfused.   ALLERGIES:  None reported.   FAMILY HISTORY:  Noncontributory.   SOCIAL HISTORY:  Nonsmoker, nondrinker.  Lives in an assisted  living group  home.   REVIEW OF SYMPTOMS:  GENERAL:  No weight loss or night sweats.  ENDOCRINE:  No history of diabetes or thyroid problems.  SKIN:  No rash or pruritus.  EYES:  No icterus or change in vision.  ENT:  No aphthous ulcers or chronic  sore throat.  RESPIRATORY:  Shortness of breath which has resolved with  transfusion.  CARDIAC:  No chest pain, palpitations, or history of valvular  heart disease.  GASTROINTESTINAL:  As above.  The patient has no problems  with constipation, lower abdominal pain, or hematochezia.  GENITOURINARY:  No dysuria or hematuria.  MUSCULOSKELETAL:  Multiple deformities of  osteogenous imperfecta.   PHYSICAL EXAMINATION:  GENERAL:  He is a very short statured adult male in  no acute distress.  VITAL  SIGNS:  Afebrile.  Blood pressure 107/46, pulse is 80 and regular.  SKIN:  Normal.  HEENT:  Eyes are anicteric.  Oropharynx unremarkable.  NECK:  Somewhat rigidly fixed, tics to the left.  There is no cervical or  inguinal adenopathy.  CHEST:  Sounds clear.  HEART:  Regular rate and rhythm.  ABDOMEN:  Mildly protuberant without mass, tenderness, or organomegaly.  RECTAL:  Not performed.  Stool is observed and is brown in color.  EXTREMITIES:  Without clubbing, cyanosis, or edema and exhibits multiple  deformities from osteogenous imperfecta.   LABORATORY DATA:  Additional laboratory test reveal a BUN of 13, prothrombin  time is 14.6.   IMPRESSION:  Profound microcytic anemia likely from chronic low grade blood  loss.  I suspect this is related to a hiatal hernia with erosions; however,  with his history of ulcer disease in the past, his stomach should be  investigated.  It would be unlikely at age 35 to have a significant colonic  lesion.  I do not know of any associations between osteogenous imperfecta  and gastrointestinal bleeding lesions.   PLAN:  I have recommended an upper endoscopy to the patient who refuses,  despite reassurance that there  should be very little difficulty performing  an upper endoscopy.  However, he does not wish to proceed with this.  Thus,  we will schedule an upper GI series to evaluate for active peptic disease  and to recheck his hiatal hernia.  If an active ulcer is seen, Helicobacter  pylori antibodies will be checked.  Hemoglobin will be reviewed following  his transfusion.                                               Althea Grimmer. Luther Parody, M.D.    PJS/MEDQ  D:  08/04/2002  T:  08/04/2002  Job:  [id number]   cc:   Ralene Ok, M.D.  [address]  [place]  [place] [address]  Fax: [phone]

## 2010-11-20 NOTE — Consult Note (Signed)
Stanley [name], Stanley [name]                       ACCOUNT NO.:  [id number]   MEDICAL RECORD NO.:  [id number]                   PATIENT TYPE:  INP   LOCATION:  0346                                 FACILITY:  [facility]   PHYSICIAN:  Althea Grimmer. Luther Parody, M.D.            DATE OF BIRTH:  April 12, 1976   DATE OF CONSULTATION:  11/01/2003  DATE OF DISCHARGE:                                   CONSULTATION   REASON FOR CONSULTATION:  Mr. Stanley [name] is a 35 year old male with multiple  congenital abnormalities related to osteogenesis imperfecta.  I first saw  him in January of 2004 when he presented with coffee-ground emesis and  profound anemia with a hemoglobin of 2.2.  He refused endoscopic evaluation  but an upper GI series demonstrated increased gastric folds and a moderate  hiatal hernia.  He has been managed on Nexium apparently 20 mg q.d. at home.  He says he missed a day of his Nexium and had some intermittent reflux  symptoms that day.  He denies any hematemesis, vomiting, or overt melena.  He also denies any abdominal pain at this time.   It should be noted that the patient reports and the chart would indicate  that his hemoglobin a month ago or so was 14, yet it was 5.4 on admission.   PAST MEDICAL HISTORY:  1. Pertinent for osteogenesis imperfecta.  2. Depression.  3. Chronic recurrent anemia.  4. Reflux.   CURRENT MEDICATIONS:  Protonix, Celexa.  As an outpatient, he was Abilify  and hemocyte plus.   FAMILY HISTORY:  Adopted.   SOCIAL HISTORY:  He has a care taker full-time.   PHYSICAL EXAMINATION:  GENERAL:  He is a debilitated male with multiple  musculoskeletal defects and extremely short in stature.  VITAL SIGNS:  Afebrile.  Blood pressure 148/77, pulse 112.  HEENT:  Eyes are anicteric.  Conjunctivae are pink.  Oropharynx  unremarkable.  CHEST:  Clear.  HEART:  Mildly tachycardic.  ABDOMEN:  Distended but nontender.  Bowel sounds are active.  I do not  appreciate any mass or  organomegaly.  RECTAL:  Not performed.  EXTREMITIES:  Without edema.  Extremely shortened and malformed.   LABORATORY DATA:  An admission hemoglobin of 5.4 that rose to 12.6 with  three units of blood.  MCV on admission was 75.7.  Platelet count normal.  Prothrombin time normal.  Albumin 3.3.  Liver function tests normal.  Fecal  occult blood testing has been negative twice.  BUN is 11, creatinine 0.4 on  admission.   IMPRESSION:  Iron-deficiency anemia which is likely from gastrointestinal  blood loss; however, the patient reports no acute bleeding and fecal occult  blood testing has been negative twice.  I discussed with him endoscopic  reevaluation, which he refuses.  I am doubtful that an upper  gastrointestinal series will be revealing since he has been on a proton pump  inhibitor and he had this study  last year.  One would expect that he was  lower gastrointestinal bleeding as his stool would be guaiac positive.  He  refuses a colonoscopy at any rate.   RECOMMENDATIONS:  I think we are forced to treat him symptomatically unless  he agrees for further evaluation.  Hemoglobin should be checked.  He should  remain on a proton pump inhibitor.  I have ordered a Helicobacter pylori  antibody study.  Consideration could be given to iridocapsular endoscopy as  an outpatient if guaiac stool is present.                                               Althea Grimmer. Luther Parody, M.D.    PJS/MEDQ  D:  11/01/2003  T:  11/01/2003  Job:  [id number]   cc:   Ralene Ok, M.D.  [address]  [place]  [place] [address]  Fax: [phone]

## 2010-11-20 NOTE — H&P (Signed)
Stanley [name], PRYER NO.:  [id number]   MEDICAL RECORD NO.:  [id number]                   PATIENT TYPE:  INP   LOCATION:  5015                                 FACILITY:  [facility]   PHYSICIAN:  Ralene Ok, M.D.                   DATE OF BIRTH:  20-Sep-1975   DATE OF ADMISSION:  08/03/2002  DATE OF DISCHARGE:                                HISTORY & PHYSICAL   This 35 year old male presented to my office with a history of upper  abdominal pain of three weeks duration and a history of vomiting coffee  colored fluid once.  Patient gave no history of definite melena, however,  says he noticed he has been looking pale.  He does have a past medical  history of peptic ulcer disease with previous chronic bleeding and several  blood transfusions.  Patient is unable to give any detailed history of his  previous evaluations, and thinks that he has had GI evaluation about a year  ago, but unable to tell me where.  His caretaker thinks he may have been in  [facility].  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  He is completely wheelchair bound and living at  assisted care for the past month.  According to the caretaker, patient has  his own power of attorney and does not contact his parents.  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  Patient normally follows Dr. Elmore Guise,  but says his last blood tests were done about a year ago in his lab.  He  keeps his regular follow up with Dr. Chilton Si.  He also says he has been off  his Nexium for a month, although Dr. Thomasene Lot office reports this patient  should be on Prilosec.   MEDICATIONS:  His other medications include:  1. Celexa 60 mg daily.  2. Risperdal 4.5 mg q.h.s.  3. Allegra 180 mg p.o. daily.  4. Multivitamin with iron.   ALLERGIES:  He has no known drug allergies.   PHYSICAL  EXAMINATION:  On examination, he is rather small with osteogenesis  imperfecta faces, with multiple scars on his lower limbs.  He is alert,  oriented times 3.  Slightly dyspneic on mild exertion.  Extremely pale.  Conjunctivae show significant pallor.  There is no icterus.  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  The pupils  were equal and reactive.  His mouth is normal.  Chest has crepitations of  the right side.  His heart rate is 110 per minute, regular.  Abdomen was  soft and nontender.  Bowel sounds were good normally. The rectal exam was  deferred. Examination of the neuro system:  Patient was alert, oriented x3,  and moving all four limbs.  Power  was significantly decreased.   IMPRESSION:  1. Significant anemia due to upper gastrointestinal bleed.  2. Osteogenesis imperfecta.  3. Previous history of psychosis.   Patient will be admitted to the hospital for evaluation of his anemia and  for blood transfusion as needed.  He will be continued on his Celexa.  We  will give him one small dose of Risperdal, and place him on Robitussin.  I  also place him on IV Protonix 20 mg q.24.                                               Ralene Ok, M.D.    RM/MEDQ  D:  08/04/2002  T:  08/04/2002  Job:  [id number]

## 2010-12-07 ENCOUNTER — Other Ambulatory Visit: Payer: Self-pay | Admitting: Family Medicine

## 2010-12-08 NOTE — Telephone Encounter (Signed)
Refill request

## 2010-12-09 ENCOUNTER — Telehealth: Payer: Self-pay | Admitting: *Deleted

## 2010-12-09 NOTE — Telephone Encounter (Signed)
Dr. Earnest Bailey actually presrcibed tablet, not chewable cetirizine. Pharmacy notified and this is corrected.

## 2010-12-09 NOTE — Telephone Encounter (Signed)
PA required for cetirizine. Form given to Dr. Earnest Bailey.

## 2011-01-11 ENCOUNTER — Telehealth: Payer: Self-pay | Admitting: Family Medicine

## 2011-01-11 DIAGNOSIS — R152 Fecal urgency: Secondary | ICD-10-CM

## 2011-01-11 NOTE — Telephone Encounter (Signed)
Asking to speak with RN re: referral that was discussed with Dr. Earnest Bailey in the past, says the referral was not done yet but had been discussed, would like to go ahead and have referral done.

## 2011-01-12 NOTE — Telephone Encounter (Signed)
Stanley [name] can you please put in a GI referral

## 2011-01-19 ENCOUNTER — Telehealth: Payer: Self-pay | Admitting: *Deleted

## 2011-01-19 NOTE — Assessment & Plan Note (Signed)
Will re submit referral placed in Jan 2012

## 2011-01-19 NOTE — Telephone Encounter (Signed)
LVM for patient to call back to inform of GI appointment set up. 8/22 @ 11am at [facility] [address], phone # [phone].Stanley [name]

## 2011-01-19 NOTE — Telephone Encounter (Signed)
See phone note 01/19/11, referral placed & pt has appt.

## 2011-01-20 NOTE — Telephone Encounter (Signed)
Called and spoke with pt and he asked me to call [phone] and give this information to [place]. Ethelene Browns received informatioo and agreed to appt time and date.Laureen Ochs, Viann Shove

## 2011-02-07 ENCOUNTER — Other Ambulatory Visit: Payer: Self-pay | Admitting: Family Medicine

## 2011-02-08 NOTE — Telephone Encounter (Signed)
Wrong Provider

## 2011-02-10 ENCOUNTER — Other Ambulatory Visit: Payer: Self-pay | Admitting: Family Medicine

## 2011-02-10 NOTE — Telephone Encounter (Signed)
Refill request

## 2011-02-24 ENCOUNTER — Encounter: Payer: Self-pay | Admitting: Gastroenterology

## 2011-02-24 ENCOUNTER — Telehealth: Payer: Self-pay | Admitting: Family Medicine

## 2011-02-24 ENCOUNTER — Ambulatory Visit (INDEPENDENT_AMBULATORY_CARE_PROVIDER_SITE_OTHER): Payer: Self-pay | Admitting: Gastroenterology

## 2011-02-24 DIAGNOSIS — R152 Fecal urgency: Secondary | ICD-10-CM

## 2011-02-24 DIAGNOSIS — R159 Full incontinence of feces: Secondary | ICD-10-CM

## 2011-02-24 DIAGNOSIS — R198 Other specified symptoms and signs involving the digestive system and abdomen: Secondary | ICD-10-CM

## 2011-02-24 NOTE — Telephone Encounter (Deleted)
Message copied by Macy Mis on Wed Feb 24, 2011 12:21 PM ------      Message from: Claudette Head T      Created: Wed Feb 24, 2011 11:39 AM       I saw your patient today in my office. Given his body size, which is the pediatric range, and his rare and complex disorder, I think he should be evaluated at a tertiary center, likely in Pediatric GI.             He has history with [facility] with ulcer disease in the remote past and I suggest that you proceed with GI referral there for all further GI care.

## 2011-02-24 NOTE — Progress Notes (Signed)
History of Present Illness: This is a 35 year old male here today with a caregiver and his mother/POA. He has osteogenesis imperfecta and has undergone multiple orthopedic procedures. He is confined to a wheelchair and is a resident of a group home. He has a history of a bleeding ulcer about 10-15 years ago which was treated at [facility]. He and his caregivers relates problems with mild constipation in the past and he used a stool softener for a number of years. About 6-9 months ago his bowels began moving more easily and he no longer needed a stool softener. Over the past 6 months or so he has been having 2-3 looser bowel movements each day and has had episodes of fecal incontinence happening about 2-4 times per month. Occasionally he has urgent stools and is unable to make it to the bathroom. Occasionally he has been in bed and wakes up after an episode of incontinence. He notes no foods or medications that lead to diarrhea and he has not had any medication changes. Denies weight loss, abdominal pain, constipation, change in stool caliber, melena, hematochezia, nausea, vomiting, dysphagia, reflux symptoms, chest pain.  Past Medical History  Diagnosis Date  . Asperger's disorder   . Osteogenesis imperfecta   . Peptic ulcer   . Depression   . Allergic rhinitis   . Hypertension   . GERD (gastroesophageal reflux disease)   . Psychosis   . Seborrheic dermatitis   . Abnormal PFTs   . Iron deficiency anemia   . Hiatal hernia    Past Surgical History  Procedure Date  . Distraction osteogenesis     22 surgeries for osetogenesis imperfecta per patient-rods placed    reports that he quit smoking about 6 years ago. He has never used smokeless tobacco. He reports that he does not drink alcohol or use illicit drugs. family history is not on file.  He is adopted. Allergies  Allergen Reactions  . Aspirin     REACTION: History of GI bleed  . Nsaids     REACTION: History of  GI bleed   Outpatient Encounter Prescriptions as of 02/24/2011  Medication Sig Dispense Refill  . albuterol (PROVENTIL,VENTOLIN) 90 MCG/ACT inhaler Inhale 1-2 puffs into the lungs every 4 (four) hours as needed.        Marland Kitchen alendronate (FOSAMAX) 35 MG tablet Take 35 mg by mouth every 7 (seven) days. Take in the morning with a full glass of water, on an empty stomach, and do not take anything else by mouth or lie down for the next 30 min.       Marland Kitchen amLODipine (NORVASC) 5 MG tablet Take 5 mg by mouth daily.        . ARIPiprazole (ABILIFY) 20 MG tablet Take 20 mg by mouth daily.        . Blood Pressure Monitoring (BLOOD PRESSURE MONITOR/M CUFF) MISC by Does not apply route. DX 401.1       . cetirizine (ZYRTEC) 10 MG tablet TAKE 1 TABLET BY MOUTH EVERY DAY  90 tablet  3  . citalopram (CELEXA) 20 MG tablet Take 20 mg by mouth daily.        . fluticasone (FLONASE) 50 MCG/ACT nasal spray 1 spray by Nasal route daily.        . methylphenidate (CONCERTA) 36 MG CR tablet Take 36 mg by mouth daily. Prescribed by Dr. Marcelle Overlie       . metoprolol tartrate (LOPRESSOR) 25 MG tablet TAKE 1 TABLET BY  MOUTH TWICE A DAY FOR BLOOD PRESSURE  60 tablet  6  . Multiple Vitamin (MULTIVITAMIN) tablet Take 1 tablet by mouth daily.        Marland Kitchen omeprazole (PRILOSEC) 20 MG capsule TAKE 1 CAPSULE TWICE A DAY  60 capsule  5    Review of Systems: Pertinent positive and negative review of systems were noted in the above HPI section. All other review of systems were otherwise negative.   Physical Exam: General: In a wheelchair, short stature, poorly developed limbs, no acute distress Head: Normocephalic and atraumatic Eyes:  sclerae anicteric, EOMI Ears: Normal auditory acuity Mouth: No deformity or lesions Neck: Supple, no masses or thyromegaly Lungs: Clear throughout to auscultation Heart: Regular rate and rhythm; no murmurs, rubs or bruits Abdomen: Soft, non tender and non distended. No masses, hepatosplenomegaly or hernias  noted. Normal Bowel sounds Rectal: Deferred as he will be referred for further evaluation Musculoskeletal: Limb deformities  Skin: No lesions on visible extremities Pulses:  Normal pulses noted Extremities: No clubbing, cyanosis, edema  Neurological: Alert oriented x 4, grossly nonfocal Cervical Nodes:  No significant cervical adenopathy Inguinal Nodes: No significant inguinal adenopathy Psychological:  Alert and cooperative. Normal mood and affect  Assessment and Recommendations:  1. Change in bowel habits with looser stools and episodes of fecal incontinence. Given his physical stature, in the pediatric size range, and his complex medical problems I feel it is prudent for him to be further evaluated and treated at a tertiary center, possibly in the pediatric gastroenterology division. Consideration for colonoscopy and anorectal manometry. Trial of Imodium daily when necessary. Given his history of at [facility], both in GI and Orthopedics, all are in agreement to return there for further GI care. I will contact Dr. Earnest Bailey to make the appropriate referral.  2. GERD and prior history of ulcer disease. Continue omeprazole 20 mg every morning.

## 2011-02-24 NOTE — Patient Instructions (Addendum)
Use Imodium every day as needed for loose stools.  Please contact  Dr. Earnest Bailey to discuss a referral back to [facility], possibly Pediatric Gastroenterology.   cc: Delbert Harness, MD

## 2011-02-25 ENCOUNTER — Telehealth: Payer: Self-pay | Admitting: *Deleted

## 2011-02-25 NOTE — Telephone Encounter (Signed)
Referred to [facility] pediatric GI per recommendations of Dr Russella Dar.

## 2011-02-25 NOTE — Telephone Encounter (Signed)
Clinical staff will proceed with referral.

## 2011-02-25 NOTE — Telephone Encounter (Signed)
Faxing referral for review to [facility] ped. GI [phone]. Dr. Earnest Bailey spoke with Burna Mortimer and referral is being faxed for physician reviewal

## 2011-02-25 NOTE — Telephone Encounter (Signed)
Received call from  [facility] , phone [phone],  stating that the head of the dept, Dr. Loleta Chance, advises patient needs to be referred to adult GI. The # for adult  GI is [phone]. Will forward to Dr. Earnest Bailey and white team

## 2011-02-27 ENCOUNTER — Emergency Department (HOSPITAL_COMMUNITY): Payer: Medicaid Other

## 2011-02-27 ENCOUNTER — Emergency Department (HOSPITAL_COMMUNITY)
Admission: EM | Admit: 2011-02-27 | Discharge: 2011-02-27 | Disposition: A | Discharge status: Other Acute Inpatient Hospital | Payer: Medicaid Other | Source: Home / Self Care | Attending: Emergency Medicine | Admitting: Emergency Medicine

## 2011-02-27 ENCOUNTER — Inpatient Hospital Stay (HOSPITAL_COMMUNITY)
Admission: EM | Admit: 2011-02-27 | Discharge: 2011-03-02 | DRG: 948 | Disposition: A | Discharge status: Home / Self Care | Payer: Medicaid Other | Source: Other Acute Inpatient Hospital | Attending: Family Medicine | Admitting: Family Medicine

## 2011-02-27 ENCOUNTER — Inpatient Hospital Stay (HOSPITAL_COMMUNITY): Payer: Medicaid Other

## 2011-02-27 ENCOUNTER — Encounter: Payer: Self-pay | Admitting: Family Medicine

## 2011-02-27 DIAGNOSIS — Q780 Osteogenesis imperfecta: Secondary | ICD-10-CM

## 2011-02-27 DIAGNOSIS — L719 Rosacea, unspecified: Secondary | ICD-10-CM | POA: Diagnosis present

## 2011-02-27 DIAGNOSIS — E871 Hypo-osmolality and hyponatremia: Secondary | ICD-10-CM | POA: Diagnosis present

## 2011-02-27 DIAGNOSIS — H9190 Unspecified hearing loss, unspecified ear: Secondary | ICD-10-CM | POA: Diagnosis present

## 2011-02-27 DIAGNOSIS — I10 Essential (primary) hypertension: Secondary | ICD-10-CM | POA: Diagnosis present

## 2011-02-27 DIAGNOSIS — K449 Diaphragmatic hernia without obstruction or gangrene: Secondary | ICD-10-CM | POA: Diagnosis present

## 2011-02-27 DIAGNOSIS — K219 Gastro-esophageal reflux disease without esophagitis: Secondary | ICD-10-CM | POA: Diagnosis present

## 2011-02-27 DIAGNOSIS — R32 Unspecified urinary incontinence: Secondary | ICD-10-CM | POA: Diagnosis present

## 2011-02-27 DIAGNOSIS — R269 Unspecified abnormalities of gait and mobility: Secondary | ICD-10-CM | POA: Diagnosis present

## 2011-02-27 DIAGNOSIS — D649 Anemia, unspecified: Secondary | ICD-10-CM | POA: Diagnosis present

## 2011-02-27 DIAGNOSIS — I517 Cardiomegaly: Secondary | ICD-10-CM | POA: Diagnosis present

## 2011-02-27 DIAGNOSIS — K279 Peptic ulcer, site unspecified, unspecified as acute or chronic, without hemorrhage or perforation: Secondary | ICD-10-CM | POA: Diagnosis present

## 2011-02-27 DIAGNOSIS — F3289 Other specified depressive episodes: Secondary | ICD-10-CM

## 2011-02-27 DIAGNOSIS — R4182 Altered mental status, unspecified: Principal | ICD-10-CM | POA: Diagnosis present

## 2011-02-27 DIAGNOSIS — L219 Seborrheic dermatitis, unspecified: Secondary | ICD-10-CM | POA: Diagnosis present

## 2011-02-27 DIAGNOSIS — F339 Major depressive disorder, recurrent, unspecified: Secondary | ICD-10-CM | POA: Diagnosis present

## 2011-02-27 DIAGNOSIS — M25569 Pain in unspecified knee: Secondary | ICD-10-CM | POA: Diagnosis present

## 2011-02-27 DIAGNOSIS — J690 Pneumonitis due to inhalation of food and vomit: Secondary | ICD-10-CM

## 2011-02-27 DIAGNOSIS — F848 Other pervasive developmental disorders: Secondary | ICD-10-CM | POA: Diagnosis present

## 2011-02-27 DIAGNOSIS — G319 Degenerative disease of nervous system, unspecified: Secondary | ICD-10-CM | POA: Insufficient documentation

## 2011-02-27 DIAGNOSIS — F329 Major depressive disorder, single episode, unspecified: Secondary | ICD-10-CM

## 2011-02-27 DIAGNOSIS — D508 Other iron deficiency anemias: Secondary | ICD-10-CM

## 2011-02-27 DIAGNOSIS — H543 Unqualified visual loss, both eyes: Secondary | ICD-10-CM | POA: Diagnosis present

## 2011-02-27 LAB — DIFFERENTIAL
Basophils Absolute: 0 10*3/uL (ref 0.0–0.1)
Basophils Relative: 0 % (ref 0–1)
Monocytes Absolute: 0.9 10*3/uL (ref 0.1–1.0)
Neutro Abs: 13.6 10*3/uL — ABNORMAL HIGH (ref 1.7–7.7)

## 2011-02-27 LAB — COMPREHENSIVE METABOLIC PANEL
ALT: 19 U/L (ref 0–53)
AST: 26 U/L (ref 0–37)
Calcium: 9.4 mg/dL (ref 8.4–10.5)
Sodium: 129 mEq/L — ABNORMAL LOW (ref 135–145)
Total Protein: 7.5 g/dL (ref 6.0–8.3)

## 2011-02-27 LAB — CBC
Hemoglobin: 12.6 g/dL — ABNORMAL LOW (ref 13.0–17.0)
MCHC: 34.4 g/dL (ref 30.0–36.0)
RDW: 14.4 % (ref 11.5–15.5)
WBC: 15.4 10*3/uL — ABNORMAL HIGH (ref 4.0–10.5)

## 2011-02-27 LAB — URINALYSIS, ROUTINE W REFLEX MICROSCOPIC
Glucose, UA: NEGATIVE mg/dL
Leukocytes, UA: NEGATIVE
Protein, ur: NEGATIVE mg/dL
Urobilinogen, UA: 0.2 mg/dL (ref 0.0–1.0)

## 2011-02-27 NOTE — H&P (Signed)
[facility] Admission History and Physical  Patient name: Stanley [name] Medical record number: [id number] Date of birth: April 29, 1976 Age: 35 y.o. Gender: male  Primary Care Provider: Delbert Harness, MD, MD  Chief Complaint: altered mental status  History of Present Illness: Stanley [name] is a 35 y.o. year old male presenting with altered mental status x 1 day.  Caregiver reports that pt was very drowsy and confused this am upon awakening. He remained confused and drowsy while going about normal morning routine, toileting and bathing.  Pt had 2 episodes of vomiting this AM.  Would have moments that he seemed lucid and would respond appropriately followed by moments where he wouldn't respond or answer questions. Caregiver realized that he wasn't waking up or "coming to" so he decided to call 911 to have pt evaluated.  Pt was transported to ER where pt's parents met him and his caregiver.  He remained confused and drowsy.  Per family disoriented to place and time.  Talking but not always making sense.  Around 3pm pt began to "come to" and although still sleepy seemed to be acting more awake and like himself per family. On evaluation in ER no infection was found on CXR or Urine. Head CT was negative for acute findings.  Pt was transferred from [facility] ER to [facility] to be admitted by [facility] for further evaluation.  + rash around neck line.  + h/a last night.  No fever.  + loose stools off and on for months.  Denies falls or head trauma.  Denies using any drugs or medicines or herbs that are not on medicine list.   ROS: as per above.  No abd pain. No CP.  No SOB.    Patient Active Problem List  Diagnoses  . DEPRESSION, MAJOR, RECURRENT  . PSYCHOSIS, UNSPECIFIED  . VISION IMPAIR BOTH EYES IMPAIR LEVEL NFS  . LOSS, HEARING NOS  . HYPERTENSION, BENIGN SYSTEMIC  . CARDIOMEGALY  . RHINITIS, ALLERGIC  . GASTROESOPHAGEAL REFLUX, NO ESOPHAGITIS  . PEPTIC ULCER  DIS., UNSPEC. W/O OBSTRUCTION  . SEBORRHEIC DERMATITIS, NOS  . ACNE ROSACEA  . KNEE PAIN, LEFT  . OSTEOGENESIS IMPERFECTA, CONGENITAL  . OTHER URINARY INCONTINENCE  . Fecal urgency  . Gait difficulty   Past Medical History: Past Medical History  Diagnosis Date  . Asperger's disorder   . Osteogenesis imperfecta   . Peptic ulcer   . Depression   . Allergic rhinitis   . Hypertension   . GERD (gastroesophageal reflux disease)   . Psychosis   . Seborrheic dermatitis   . Abnormal PFTs   . Iron deficiency anemia   . Hiatal hernia     Past Surgical History: Past Surgical History  Procedure Date  . Distraction osteogenesis     22 surgeries for osetogenesis imperfecta per patient-rods placed  Brain hemorrhage- had brain surgery to "remove blood from the brain"- this occurred after a fall.   Social History: History   Social History  . Marital Status: Single    Spouse Name: N/A    Number of Children: N/A  . Years of Education: N/A   Occupational History  . Disabled     lives in group home, non ambulatory   Social History Main Topics  . Smoking status: Former Smoker    Quit date: 06/04/2004  . Smokeless tobacco: Never Used  . Alcohol Use: No  . Drug Use: No  . Sexually Active: Not on file   Other Topics Concern  .  Not on file   Social History Narrative  . No narrative on file    Family History: Family History  Problem Relation Age of Onset  . Adopted: Yes    Allergies: Allergies  Allergen Reactions  . Aspirin     REACTION: History of GI bleed  . Nsaids     REACTION: History of GI bleed    Current Outpatient Prescriptions  Medication Sig Dispense Refill  . albuterol (PROVENTIL,VENTOLIN) 90 MCG/ACT inhaler Inhale 1-2 puffs into the lungs every 4 (four) hours as needed.        Marland Kitchen alendronate (FOSAMAX) 35 MG tablet Take 35 mg by mouth every 7 (seven) days. Take in the morning with a full glass of water, on an empty stomach, and do not take anything else by  mouth or lie down for the next 30 min.       Marland Kitchen amLODipine (NORVASC) 5 MG tablet Take 5 mg by mouth daily.        . ARIPiprazole (ABILIFY) 20 MG tablet Take 20 mg by mouth daily.        . Blood Pressure Monitoring (BLOOD PRESSURE MONITOR/M CUFF) MISC by Does not apply route. DX 401.1       . cetirizine (ZYRTEC) 10 MG tablet TAKE 1 TABLET BY MOUTH EVERY DAY  90 tablet  3  . citalopram (CELEXA) 20 MG tablet Take 20 mg by mouth daily.        . fluticasone (FLONASE) 50 MCG/ACT nasal spray 1 spray by Nasal route daily.        . methylphenidate (CONCERTA) 36 MG CR tablet Take 36 mg by mouth daily. Prescribed by Dr. Marcelle Overlie       . metoprolol tartrate (LOPRESSOR) 25 MG tablet TAKE 1 TABLET BY MOUTH TWICE A DAY FOR BLOOD PRESSURE  60 tablet  6  . Multiple Vitamin (MULTIVITAMIN) tablet Take 1 tablet by mouth daily.        Marland Kitchen omeprazole (PRILOSEC) 20 MG capsule TAKE 1 CAPSULE TWICE A DAY  60 capsule  5    Physical Exam: Pulse: 89  Blood Pressure: 101/58 RR: 24   O2: 98% on RA Temp: 98.5  General: alert and cooperative, short stature HEENT: PERRLA and head natural position at rest- extended to back left.  Heart: S1, S2 normal, no murmur, rub or gallop, regular rate and rhythm Lungs: unlabored breathing, rhonchi on left lung, clear on right  Abdomen: abdomen is soft without significant tenderness, masses, organomegaly or guarding Extremities: short, deformed extremities,  Chest deformity present Skin:+ petechial rash on neck/chest area Neurology: normal without focal findings, mental status, speech normal, alert and oriented x3, PERLA, cranial nerves 2-12 intact and reflexes normal and symmetric-- muscle tone and strength at pt baseline- per caregiver and family  Labs and Imaging: Lab Results  Component Value Date/Time   NA 129* 02/27/2011 11:40 AM   K 3.7 02/27/2011 11:40 AM   CL 94* 02/27/2011 11:40 AM   CO2 28 02/27/2011 11:40 AM   BUN 6 02/27/2011 11:40 AM   CREATININE <0.47* 02/27/2011 11:40 AM    GLUCOSE 108* 02/27/2011 11:40 AM  AST-26 ALT-19   Lab Results  Component Value Date   WBC 15.4* 02/27/2011   HGB 12.6* 02/27/2011   HCT 36.6* 02/27/2011   MCV 80.6 02/27/2011   PLT 209 02/27/2011   Lactic acid -- 1.2  Urinalysis--negative  CXR- Cardiomegaly with low lung volumes and vascular crowding.  Mild   interstitial edema is difficult to exclude.  Extensive osseous deformities, unchanged.  Head CT- Severe deformity of the skull with diffuse cerebral and cerebellar   atrophy.  No acute abnormality.   Assessment and Plan: Stanley [name] is a 35 y.o. year old male presenting with Altered mental status- now improving  1. Altered Mental Status- Pt now at baseline function per family. CT scan showed no acute findings. CXR- no infiltrate. But + left sided rhonchi on physical exam.  U/A- negative.  Urine culture pending.  Denies recent medication changes.  No fever.  + WBC count of 15.4.    Alert and oriented x 3 now. Will monitor closely over night.  Nuero checks q 2 hours.  Because left sided rhonchi on exam- will obtain CT angio of chest to better assess for lung infiltrate.  Chest x ray difficult to read in the setting of abnormal bony anatomy.  Will obtain CT angio in order to r/o PE, pt has had + tachycardia since arrival to stepdown.  Seizure also on differential for cause of ams- will monitor for seizure activity.  No known h/o seizure.   2. Osteogenesis imperfecta- pt on fosamax at home.  Not due for dose until next month.  3. H/o psychosis/paranoia and depression- Will continue home med regimen of abilify, celexa, and concerta. Family states this problem has been well controlled x 1 year  4. Peptic ulcer- continue omeprazole  5.Hypertension- Will continue norvasc and lopressor per home regimen.  Will hold for SBP less than 110.    6. Rash- petechial rash most likely due to vomiting this am.  Platelets wnl.  Will monitor  6. FEN/GI: regular diet  7. Prophylaxis:  omeprazole po, no dvt prophy- pt is bed/wheelchair bound at home  8. Disposition: pending continued clinical improvement.   Dictation # [id number]

## 2011-02-28 ENCOUNTER — Encounter (HOSPITAL_COMMUNITY): Payer: Self-pay | Admitting: Radiology

## 2011-02-28 LAB — DRUGS OF ABUSE SCREEN W/O ALC, ROUTINE URINE
Marijuana Metabolite: NEGATIVE
Opiate Screen, Urine: NEGATIVE
Propoxyphene: NEGATIVE

## 2011-02-28 LAB — COMPREHENSIVE METABOLIC PANEL
AST: 18 U/L (ref 0–37)
BUN: 7 mg/dL (ref 6–23)
CO2: 27 mEq/L (ref 19–32)
Chloride: 109 mEq/L (ref 96–112)
Creatinine, Ser: <0.47 mg/dL — ABNORMAL LOW (ref 0.50–1.35)
Total Bilirubin: 0.1 mg/dL — ABNORMAL LOW (ref 0.3–1.2)

## 2011-02-28 LAB — CBC
HCT: 32.5 % — ABNORMAL LOW (ref 39.0–52.0)
MCV: 82.9 fL (ref 78.0–100.0)
Platelets: 184 10*3/uL (ref 150–400)
RBC: 3.92 MIL/uL — ABNORMAL LOW (ref 4.22–5.81)
WBC: 10.1 10*3/uL (ref 4.0–10.5)

## 2011-02-28 LAB — PRO B NATRIURETIC PEPTIDE: Pro B Natriuretic peptide (BNP): 143 pg/mL — ABNORMAL HIGH (ref 0–125)

## 2011-02-28 LAB — URINE CULTURE

## 2011-02-28 MED ORDER — IOHEXOL 300 MG/ML  SOLN
60.0000 mL | Freq: Once | INTRAMUSCULAR | Status: AC | PRN
  Administered 2011-02-28: 60 mL via INTRAVENOUS

## 2011-03-01 LAB — CBC
Hemoglobin: 11.3 g/dL — ABNORMAL LOW (ref 13.0–17.0)
MCH: 27.7 pg (ref 26.0–34.0)
MCV: 83.3 fL (ref 78.0–100.0)
RBC: 4.08 MIL/uL — ABNORMAL LOW (ref 4.22–5.81)

## 2011-03-01 LAB — URINE CULTURE: Culture  Setup Time: 201208261623

## 2011-03-01 LAB — BASIC METABOLIC PANEL
Glucose, Bld: 88 mg/dL (ref 70–99)
Potassium: 3.9 mEq/L (ref 3.5–5.1)
Sodium: 140 mEq/L (ref 135–145)

## 2011-03-01 LAB — RETICULOCYTES: Retic Ct Pct: 1.1 % (ref 0.4–3.1)

## 2011-03-05 LAB — CULTURE, BLOOD (ROUTINE X 2)
Culture  Setup Time: 201208251719
Culture: NO GROWTH

## 2011-03-09 ENCOUNTER — Ambulatory Visit: Payer: Self-pay | Admitting: Sports Medicine

## 2011-03-10 NOTE — H&P (Signed)
Stanley [name], Stanley [name] NO.:  [id number]  MEDICAL RECORD NO.:  [id number]  LOCATION:  [facility]                         FACILITY:  [facility]  PHYSICIAN:  Leighton Roach Tosh Glaze, M.D.DATE OF BIRTH:  18-Jan-1976  DATE OF ADMISSION:  02/27/2011 DATE OF DISCHARGE:  02/27/2011                             HISTORY & PHYSICAL   PRIMARY CARE PROVIDER:  Delbert Harness, MD at [facility]  DISCHARGE DIAGNOSES: 1. Aspiration pneumonia 2. Altered mental status, resolved. 3. Osteogenesis imperfecta. 4. Asperger disorder. 5. Tachycardia. 6. Hypertension. 7. Anemia. 8. Gastroesophageal reflux disease.  DISCHARGE MEDICATIONS: 1. Abilify 20 mg 1 tablet by mouth every morning. 2. Albuterol inhaler 2 puffs inhaled every 4 hours as needed. 3. Amlodipine 5 mg 1 tablet by mouth every morning. 4. Celexa 20 mg 1 tablet by mouth every morning. 5. Concerta 1 tablet by mouth every morning 36 mg. 6. Fluticasone nasal allergy 1 spray nasally daily as needed. 7. Imodium 2 mg 1 capsule by mouth as needed. 8. Metoprolol tartrate 25 mg tablet by mouth twice daily. 9. Multivitamin 1 tablet by mouth every morning. 10.Omeprazole 20 mg 1 capsule by mouth twice daily. 11.Zyrtec 10 mg 1 tablet by mouth every morning.  CONSULTS:  None.  PROCEDURES/IMAGING: 1. Head CT on August 25 showed no acute intracranial process. 2. Chest x-ray on August 25 showed cardiomegaly with low lung volumes     and vascular crowding, mild interstitial edema is difficult to     exclude. 3. CT angio of the chest on August 26 showed no pulmonary embolism,     mild clustered nodularity within lingula, may represent as early     mild infection versus aspiration, also showed osseous changes in     keeping with osteogenesis imperfecta.  LABORATORY DATA:  CBC on admission showed WBC 15.4, 89% neutrophils, hemoglobin 12.6, hematocrit 36.6, platelet 209.  CMET showed a sodium of 129, potassium 3.7, chloride 94,  CO2 of 28, glucose 108, BUN 6, creatinine less than 0.47.  Bilirubin total 0.3, alk phos 103, AST 26, ALT 19, total protein 7.5, albumin 3.4, and calcium 9.4.  Venous lactic acid was normal at 1.2.  Urinalysis was within normal limits.  Urine culture, no growth.  Blood culture, no growth.  Reticulocyte count 1.1% within normal limits.  Absolute reticulocyte 44.9 within normal limits. Cortisol 8.2.  Ferritin 10 considered low.  CBC on discharge, WBC 7.9, hemoglobin 11.3, hematocrit 34, and platelet count 175.  BMET on discharge; sodium 140, potassium 3.9, chloride 105, CO2 of 29, glucose 88, BUN 11, creatinine less than 0.47, and calcium 8.8.  UDS negative. Pro-BNP 143.  BRIEF HOSPITAL COURSE:  A 35 year old male with osteogenesis imperfecta, Asperger's, psychosis, paranoia, hypertension, anemia, and GERD who presented with altered mental status. 1. Altered mental status.  Once in hospital, the patient returned to     baseline mental status.  The patient's Abilify had been held in the     hospital with improvement of symptoms and it was initially thought     that the altered mental status could be due to the Abilify.     However, this was a chronic medication with no recent change in  dose and is therefore unlikely that it was due to this.  The     patient's sodium was low on arrival which could explain the mental     status change.  Sodium returned to baseline subsequently and sodium     was normal on discharge.  Head CT showed no acute findings.  UDS     was negative.  Apart from a mild infection, no other infection     source was detected.  The patient was started on Rocephin and     azithromycin.  Since the patient was afebrile with a normal WBC on     discharge, his antibiotics were stopped  The patient was discharged     back to his baseline mental status. 2. Tachycardia.  The patient was found to be tachycardic in 130.  CT     angio was done and showed no evidence of PE.  The  patient was     started back on metoprolol 25 mg b.i.d. and heart rate slowed down     to the 100s.  The patient had an EKG showing some mild prolonged     QTc.  A repeat EKG on discharge showed no prolonged QTc.     Azithromycin which can cause long QT was stopped.  We decided to     keep the patient on home dose of Celexa despite its possible side     effects of long QT.  Heart rate of the patient on discharge was     100. 3. Hypertension.  The patient started back on metoprolol and     amlodipine and ranged between 118-130/73-84. 4. History of psychosis paranoia.  The patient was back on Celexa,     Abilify, and Concerta during hospitalization.  The patient was     discharged on these medications. 5. GERD.  He was Prilosec during hospitalization and discharged on     Prilosec as well. 6. Anemia.  Had low ferritin and normal reticulocyte count.  Can     follow up as outpatient. 7. Osteogenesis imperfecta.  At baseline and stable during the     hospitalization.  DISCHARGE INSTRUCTIONS:  Diet:  The patient was found to eat quickly and take big bites at a time.  Speech therapy recommended that the patient eat small bites at a time during meals to avoid aspiration. The patient was advised to return to the hospital or to the clinic if any more mental status changes occur, any fevers, or any increase in heart rate.  FOLLOWUP APPOINTMENTS:  The patient has an appointment with Dr. Earnest Bailey on March 11, 2011, at 1:30 p.m.  About the patient's Celexa, with history of long QT on one EKG, pharmacy recommended to keep the Celexa at the current dose without increasing the dose.  DISCHARGE CONDITION:  The patient was discharged home with his alternative family living provider in stable medical condition.    ______________________________ Marena Chancy, MD   ______________________________ Leighton Roach Aizah Gehlhausen, M.D.    SL/MEDQ  D:  03/04/2011  T:  03/04/2011  Job:   [id number]  Electronically Signed by Marena Chancy MD on 03/06/2011 04:47:51 PM Electronically Signed by Acquanetta Belling M.D. on 03/10/2011 08:25:00 AM

## 2011-03-11 ENCOUNTER — Encounter: Payer: Self-pay | Admitting: Family Medicine

## 2011-03-11 ENCOUNTER — Ambulatory Visit (INDEPENDENT_AMBULATORY_CARE_PROVIDER_SITE_OTHER): Payer: Medicaid Other | Admitting: Family Medicine

## 2011-03-11 ENCOUNTER — Ambulatory Visit: Payer: Medicaid Other | Admitting: Family Medicine

## 2011-03-11 DIAGNOSIS — K219 Gastro-esophageal reflux disease without esophagitis: Secondary | ICD-10-CM

## 2011-03-11 DIAGNOSIS — R4182 Altered mental status, unspecified: Secondary | ICD-10-CM | POA: Insufficient documentation

## 2011-03-11 DIAGNOSIS — R152 Fecal urgency: Secondary | ICD-10-CM

## 2011-03-11 NOTE — Assessment & Plan Note (Signed)
Pending referral to [facility].

## 2011-03-11 NOTE — Assessment & Plan Note (Signed)
Worsening of symptoms in past few weeks despite being on PPI.  H pylori today negative.  Advised to discuss when he goes to follow-up with GI.

## 2011-03-11 NOTE — Progress Notes (Signed)
  Subjective:    Patient ID: Geoffry Paradise, male    DOB: 23-Oct-1975, 35 y.o.   MRN: [id number]  HPI  Here for hospital follow-up- was admitted for 1 day of AMS sleepy, less talkative.  Normal head imaging, CT angio of chest showed possible infiltrate, had course of antibiotics, but was discontinued before discharge as he showed no clinical signs of pneumonia.   Unclear etiology, lab abnormalities of hyponatremia, tachycardia, low ferritin.   Lab abd resolved prior to discharge.  Now back at baseline. Review of Systems    Continued fecal urgency. Objective:   Physical Exam GEN: Alert & Oriented, No acute distress.  Short stature- OI CV:  Regular Rate & Rhythm, no murmur Respiratory:  Normal work of breathing, CTAB Abd:  + BS, soft, no tenderness to palpation        Assessment & Plan:

## 2011-03-11 NOTE — Assessment & Plan Note (Signed)
Resolved.  No known cause, no change in medications.  Will continue to monitor.

## 2011-03-11 NOTE — Patient Instructions (Signed)
Will check H Pylori today You will hear about an appt at [facility] in the next week.

## 2011-03-17 ENCOUNTER — Ambulatory Visit (INDEPENDENT_AMBULATORY_CARE_PROVIDER_SITE_OTHER): Payer: Medicaid Other | Admitting: Family Medicine

## 2011-03-17 VITALS — BP 111/77

## 2011-03-17 DIAGNOSIS — Q780 Osteogenesis imperfecta: Secondary | ICD-10-CM

## 2011-03-17 NOTE — Assessment & Plan Note (Signed)
Patient obviously needs a motorized wheelchair.  [facility]  We will defer the type and measurements of the new wheel chair to [facility].  I have discussed the continuing need for a motorized wheelchair by telephone with [facility] at this visit.  He has a Rx for the wheel chair which was issued to him by his primary care provider.

## 2011-03-17 NOTE — Progress Notes (Signed)
Subjective:    Patient ID: Stanley [name], male    DOB: [birth year], 35 y.o.   MRN: [id number]  HPI Pt is here for evaluation on mobility and in need for a new motorized wheelchair.  Pt has had his for 5 years.  Past Medical History  Diagnosis Date  . Asperger's disorder   . Osteogenesis imperfecta   . Peptic ulcer   . Depression   . Allergic rhinitis   . Hypertension   . GERD (gastroesophageal reflux disease)   . Psychosis   . Seborrheic dermatitis   . Abnormal PFTs   . Iron deficiency anemia   . Hiatal hernia    Past Surgical History  Procedure Date  . Distraction osteogenesis     27 surgeries for osetogenesis imperfecta per patient-rods placed    reports that he quit smoking about 3 years ago. He has never used smokeless tobacco. He reports that he does drink alcohol occasionally  Denies use illicit drugs. family history is not on file.  He is adopted. Allergies  Allergen Reactions  . Aspirin     REACTION: History of GI bleed  . Nsaids     REACTION: History of GI bleed   Outpatient Encounter Prescriptions as of 03/17/2011  Medication Sig Dispense Refill  . albuterol (PROVENTIL,VENTOLIN) 90 MCG/ACT inhaler Inhale 1-2 puffs into the lungs every 4 (four) hours as needed.        Marland Kitchen alendronate (FOSAMAX) 35 MG tablet Take 35 mg by mouth every 7 (seven) days. Take in the morning with a full glass of water, on an empty stomach, and do not take anything else by mouth or lie down for the next 30 min.       Marland Kitchen amLODipine (NORVASC) 5 MG tablet Take 5 mg by mouth daily.        . ARIPiprazole (ABILIFY) 20 MG tablet Take 20 mg by mouth daily.        . Blood Pressure Monitoring (BLOOD PRESSURE MONITOR/M CUFF) MISC by Does not apply route. DX 401.1       . cetirizine (ZYRTEC) 10 MG tablet TAKE 1 TABLET BY MOUTH EVERY DAY  90 tablet  3  . citalopram (CELEXA) 20 MG tablet Take 20 mg by mouth daily.        . fluticasone (FLONASE) 50 MCG/ACT nasal spray 1 spray by Nasal route daily.         . methylphenidate (CONCERTA) 36 MG CR tablet Take 36 mg by mouth daily. Prescribed by Dr. Marcelle Overlie       . metoprolol tartrate (LOPRESSOR) 25 MG tablet TAKE 1 TABLET BY MOUTH TWICE A DAY FOR BLOOD PRESSURE  60 tablet  6  . Multiple Vitamin (MULTIVITAMIN) tablet Take 1 tablet by mouth daily.        Marland Kitchen omeprazole (PRILOSEC) 20 MG capsule TAKE 1 CAPSULE TWICE A DAY  60 capsule  5  Imodium 4mg  daily    Review of Systems Denies fever, chills, nausea or vomiting, abdominal pain, numbness or weakness,     Objective:   Physical Exam Ht 3 feet 1 inch  Weight 63 # General: In a wheelchair, short stature, poorly developed limbs, no acute distress Head: Normocephalic and atraumatic Eyes:  sclerae anicteric, EOMI Mouth: No deformity or lesions Neck: Supple, no masses or thyromegaly Lungs: Clear throughout to auscultation Heart: Regular rate and rhythm; no murmurs, rubs or bruits Abdomen: Soft, non tender and non distended. No masses, hepatosplenomegaly or hernias noted. Normal Bowel sounds Rectal: Deferred  Musculoskeletal: Limb deformities secondary from osteogenesis imperfecta unable to bear weight on own or ambulate, pt does all  Skin: No lesions on visible extremities Pulses:  Normal pulses noted Extremities: No clubbing, cyanosis, edema  Neurological: Alert oriented x 4, grossly nonfocal Psychological:  Alert and cooperative. Normal mood and affect    Assessment & Plan:

## 2011-03-19 NOTE — Discharge Summary (Addendum)
NAMECORDIE, BUENING NO.:  [id number]  MEDICAL RECORD NO.:  [id number]  LOCATION:  [facility]                         FACILITY:  [facility]  PHYSICIAN:  Leighton Roach Jaiyana Canale, M.D.DATE OF BIRTH:  15-Oct-1975  DATE OF ADMISSION:  02/27/2011 DATE OF DISCHARGE:  02/27/2011                              DISCHARGE SUMMARY   PRIMARY CARE PROVIDER:  Delbert Harness, MD at [facility].  DISCHARGE DIAGNOSES: 1. Aspiration pneumonia. 2. Altered mental status, resolved. 3. Osteogenesis imperfecta. 4. Asperger disorder. 5. Tachycardia. 6. Hypertension. 7. Anemia. 8. Gastroesophageal reflux disease.  DISCHARGE MEDICATIONS: 1. Abilify 20 mg 1 tablet by mouth every morning. 2. Albuterol inhaler 2 puffs inhaled every 4 hours as needed. 3. Amlodipine 5 mg 1 tablet by mouth every morning. 4. Celexa 20 mg 1 tablet by mouth every morning. 5. Concerta 1 tablet by mouth every morning 36 mg. 6. Fluticasone nasal allergy 1 spray nasally daily as needed. 7. Imodium 10 mg 1 capsule by mouth as needed. 8. Metoprolol tartrate 25 mg tablet by mouth twice daily. 9. Multivitamin 1 tablet by mouth every morning. 10.Omeprazole 20 mg 1 capsule by mouth twice daily. 11.Zyrtec 10 mg 1 tablet by mouth every morning.  CONSULTS:  None.  PROCEDURES/IMAGING: 1. Head CT February 27, 2011, showed no acute intracranial process. 2. Chest x-ray February 27, 2011, showed cardiomegaly with low lung     volumes and vascular crowding, mild interstitial edema, but this     was difficult to exclude. 3. CT angio of the chest on February 28, 2011, showed no pulmonary     embolism, mild clustered nodularity that may represent early mild     infection versus aspiration.  Also showed osseous changes in     keeping with osteogenesis imperfecta.  LABORATORY DATA:  CBC on admission showed WBC 15.4, 89% neutrophils, hemoglobin 12.6, hematocrit 36.6, platelets 209.  CMET showed a sodium of 129, potassium  3.7, chloride 94, CO2 of 28, glucose 108, BUN 6, creatinine less than 0.47, total bilirubin 0.3, alk phos 103, AST 26, ALT 19, total protein 7.5, albumin 3.1, calcium 9.4.  Venous lactic acid was normal at 1.2.  Urinalysis was within normal limits.  Urine culture, no growth.  Blood culture, no growth.  Reticulocyte count 1.1% within normal limits.  Absolute reticulocyte 44.9 within normal limits. Cortisol 8.2, ferritin 10 considered low.  CBC on discharge WBC 7.9, hemoglobin 11.3, hematocrit 34, platelet count 175.  BMET on discharge; sodium 140, potassium 3.9, chloride 105, CO2 of 29, glucose 88, BUN 11, creatinine less than 0.47, calcium 8.8.  UDS negative.  Pro BNP 143.  BRIEF HOSPITAL COURSE:  A 35 year old male with osteogenesis imperfecta, Asperger, psychosis, paranoia, hypertension, anemia, and gastroesophageal reflux disease who presented with altered mental status. 1. Altered mental status.  Once in hospital, the patient returned to     baseline mental status.  The patient's Abilify had been held in the    hospital with improvement of symptoms and was initially thought     that the altered mental status could be due to the Abilify.     However, this was a chronic medication with no recent change in  dose and it is therefore unlikely that it was due to this.  The     patient's sodium was low on arrival, which could explain mental     status change.  Sodium returned to baseline subsequently, and     sodium was normal on discharge.  Head CT showed no acute findings.     UDS was negative.  Apart from mild infection, no other infection     was detected.  The patient was started on Rocephin and     azithromycin.  Since the patient was afebrile with a normal WBC on     discharge, antibiotics were stopped.  The patient was discharged     back to baseline mental status. 2. Tachycardia.  The patient was found to be tachycardic in the 130s.     CT angio was done and showed no evidence of  PE.  The patient was     started back on metoprolol 25 mg b.i.d. and heart rate slowed down     to 100.  The patient had an EKG showing some mild prolonged QTC.     Repeat EKG on discharge showed no prolonged QTC.  Azithromycin     which can cause long QT was stopped.  We decided to keep the     patient on home dose of Celexa despite its possible side effects of     long QT.  Heart rate of the patient on discharge was 100. 3. Hypertension.  The patient started back on metoprolol and     amlodipine and ranged between 118-130 over 73-84. 4. History of psychosis paranoia.  The patient was back on Celexa,     Abilify, and Concerta during hospitalization.  The patient was     discharged on these medications. 5. GERD.  He was given Prilosec during hospitalization and discharged     on Prilosec as well. 6. Anemia, had low ferritin and normal reticulocyte count.  Can follow     up as an outpatient. 7. Osteogenesis imperfecta at baseline, is stable during     hospitalization.  DISCHARGE INSTRUCTIONS:  Diet.  The patient was found to eat quickly and take big bites at a time.  Speech therapy recommended that the patient eat small bites at a time during meals to avoid aspiration.  The patient was advised to return to the hospital or to the clinic if at any moment altered mental status changes occur, any fevers, or any increase in the heart beat.  FOLLOWUP APPOINTMENTS:  The patient has an appointment with Dr. Earnest Bailey on March 11, 2011, at 1:30 p.m.  Because of the patient's Celexa with history of long QT interval on one EKG, pharmacy recommended to keep the Celexa at the current dose without increasing the dose.  DISCHARGE CONDITION:  The patient was discharged home with his alternative family living provider in stable medical condition.    ______________________________ Marena Chancy, MD   ______________________________ Leighton Roach Abi Shoults, M.D.    SL/MEDQ  D:  03/14/2011  T:   03/15/2011  Job:  [id number]  Electronically Signed by Marena Chancy MD on 03/23/2011 09:21:59 PM Electronically Signed by Acquanetta Belling M.D. on 03/26/2011 03:11:43 PM

## 2011-04-06 ENCOUNTER — Telehealth: Payer: Self-pay | Admitting: Family Medicine

## 2011-04-06 NOTE — Telephone Encounter (Signed)
Needs to have wheel chair eval done by Physical Therapy for [facility] - needs orders for him to get this eval.

## 2011-04-07 NOTE — Telephone Encounter (Signed)
Would you please call and give them a verbal order and ask them to fax over any paperwork they need me to sign?  Thanks!

## 2011-04-07 NOTE — Telephone Encounter (Signed)
Spoke with pt's caregiver Ethelene Browns. He stated that medicaid wants evaluation done. And they told him to speak with neuro rehab to be evaluated for this, he is going to call their office to speak with them and will call us back with instructions of what will need to happen next.Laureen Ochs, Viann Shove

## 2011-04-07 NOTE — Telephone Encounter (Signed)
Thank you for getting the details.

## 2011-04-09 NOTE — Telephone Encounter (Signed)
Is calling for a refill for Fosamax.  [facility] on [address], is the pharmacy and he will contact them to send a refill request.  He was concerned because he didn't realize it was time for a refill and there isn't enough to last through the weekend.

## 2011-04-12 ENCOUNTER — Other Ambulatory Visit: Payer: Self-pay | Admitting: Family Medicine

## 2011-04-13 NOTE — Telephone Encounter (Signed)
Refill request

## 2011-04-13 NOTE — H&P (Signed)
[name], Stanley          ACCOUNT NO.:  [id number]  MEDICAL RECORD NO.:  [id number]  LOCATION:  WLED                         FACILITY:  [facility]  PHYSICIAN:  Stanley [name], M.D.DATE OF BIRTH:  May 05, 1976  DATE OF ADMISSION:  02/27/2011 DATE OF DISCHARGE:  02/27/2011                             HISTORY & PHYSICAL   PRIMARY CARE DOCTOR:  Stanley Harness, MD  CHIEF COMPLAINT:  Altered mental status.  HISTORY OF PRESENT ILLNESS:  The patient is a 35 year old male presenting with altered mental status x1 day.  Caregiver reports that the patient was very drowsy and confused this morning upon awakening. He remained confused and drowsy while going about normal morning routine toileting and bathing.  The patient had two episodes of vomiting this morning, would have moments where he seemed lucid and would respond appropriately followed by moments where he would not respond or answer questions.  Caregiver realized that he was not waking up or coming to, so he decided to call 911 to have the patient evaluated.  The patient was transported to the ER where the patient's parents met him and his caregiver.  He confused and drowsy.  Per family, the patient was disoriented to place and time talking, but not always making sense. Around 3 p.m. today while in the emergency department, the patient began to come to and, although still sleepy seemed to be acting more awake and like himself per family.  On evaluation in the ER, no infection was found on chest x-ray or in urine.  Head CT was negative for acute findings.  The patient was transferred from [facility] ER to [facility] to be admitted by [facility] for further evaluation.  Positive rash around neck line, positive headache last night.  No fever. Positive loose stools off and on for months.  Denies falls or head trauma.  Denies using any drugs or medicines or herbs that are not on medicine list.  REVIEW OF SYSTEMS:  As per above.   No abdominal pain, no chest pain, no shortness of breath.  PAST MEDICAL HISTORY: 1. Asperger disorder. 2. Osteogenesis imperfecta. 3. Peptic ulcer disease. 4. Depression. 5. Allergic rhinitis. 6. Hypertension. 7. GERD. 8. Psychosis. 9. Iron-deficiency anemia. 10.Hiatal hernia.  PAST SURGICAL HISTORY: 1. 22 different surgeries for osteogenesis imperfecta per patient     report. 2. Brain hemorrhage, had brain surgery to remove blood from the brain     "this occurred after a fall."  SOCIAL HISTORY:  The patient lives with caregiver in a group home.  He is a former smoker, quit in 2005.  Denies drugs.  Denies alcohol.  FAMILY HISTORY:  The patient is adopted.  ALLERGIES:  ASPIRIN and NSAIDS due to history of GI bleed.  OUTPATIENT MEDICATIONS: 1. Albuterol 1-2 puffs to the lungs q.4 h. as needed. 2. Fosamax 75 mg by mouth q.7 days. 3. Norvasc 5 mg p.o. daily. 4. Abilify 20 mg p.o. daily. 5. Zyrtec 10 mg 1 tablet by mouth every day. 6. Celexa 20 mg by mouth daily. 7. Flonase per nose daily. 8. Concerta 36 mg by mouth daily. 9. Lopressor 25 mg tablets one tab by mouth b.i.d. for blood pressure. 10.Multivitamin daily. 11.Prilosec b.i.d. 20  mg.  PHYSICAL EXAMINATION:  VITAL SIGNS:  Pulse 89, blood pressure 101/58, respiratory rate 24, pulse ox 98% on room air, temperature 98.5. GENERAL:  Alert and cooperative, short stature. HEENT:  Pupils equal, round, and reactive to light and accommodation. Head at rest is extended to back left. HEART:  S1 and S2 normal.  No murmurs, rubs, or gallops.  Regular rate and rhythm. LUNGS:  Unlabored breathing.  Positive rhonchi on left.  Clear on the right. ABDOMEN:  Soft without tenderness, masses, or guarding. EXTREMITIES:  Short deformed extremities.  Test deformity present. SKIN:  Positive petechial rash on neck and chest area. NEUROLOGY:  Normal without focal findings.  Mental status is normal. Speech normal.  Alert and oriented x3.   Pupils equal, round, and reactive to light and accommodation.  Cranial nerves II through XII intact.  Reflexes normal and symmetric.  Muscle tone and strength at the patient's baseline per caregiver and family.  LABS AND IMAGING:  Sodium 129, potassium 3.7, chloride 94, bicarb 28, BUN 6, creatinine 0.47, AST 26, ALT 19.  White blood cells 15.4, hemoglobin 12.6, platelets 209.  Lactic acid 1.2.  Urinalysis negative. Chest x-ray showed cardiomegaly with low lung volumes, vascular crowding, mild interstitial edema is difficult to exclude, osseous deformities unchanged.  Head CT severe deformity of the skull with diffuse cerebral and cerebellar atrophy, no acute abnormality.  ASSESSMENT/PLAN:  Stanley [name] is a 35 year old man presenting with altered mental status at this time improving. 1. Altered mental status.  We will admit the patient to step down unit     for monitoring overnight.  The patient is currently at baseline     function per family.  CT scan showed no acute findings.  Chest x-     ray showed no infiltrate, but positive left-sided rhonchi on     physical exam.  UA negative.  Urine culture pending.  Denies recent     medication changes.  No fever, but positive white count of 15.4.     Alert and oriented x3 now.  We will monitor closely overnight.     Neuro checks q.2 h.  Because left-sided rhonchi on exam, we will     obtain CT angio of chest to better assess for lung infiltrate.     Chest x-ray difficult to read in the setting of abnormal bony     anatomy.  We will obtain CT angio in order to rule out PE.  The     patient has had positive tachycardia since arrival to step-down     unit.  Seizure is also on differential for cause of altered mental     status.  We will monitor for seizure activity.  No known history of     seizure.  We will also obtain urine drug screen. 2. Osteogenesis imperfecta.  The patient on Fosamax at home not due     for another dose until next  month. 3. History of psychosis, paranoia, and depression.  We will continue     home med regimen of Abilify, Celexa, and Concerta.  Family states     this issue has been well controlled x1 year. 4. Peptic ulcer.  Continue omeprazole. 5. Hypertension.  We will continue Norvasc and Lopressor per home     regimen.  We will hold for SBP less than 110. 6. Petechial rash on chest, this is most likely secondary to vomiting     earlier today, platelets are within normal limits.  We will  monitor     closely. 7. Fluid, electrolytes, and nutrition/gastrointestinal:  Regular diet. 8. Prophylaxis.  Omeprazole p.o.  Regular diet.  No DVT prophylaxis.     The patient is bed vasculature bound at home. 9. Disposition pending clinical improvement.     Ellin Mayhew, MD   ______________________________ Etta Grandchild, M.D.    DC/MEDQ  D:  02/27/2011  T:  02/28/2011  Job:  [id number]  Electronically Signed by Ellin Mayhew  on 04/08/2011 11:18:58 AM Electronically Signed by Acquanetta Belling M.D. on 04/13/2011 03:47:56 PM

## 2011-04-21 ENCOUNTER — Telehealth: Payer: Self-pay | Admitting: Family Medicine

## 2011-04-21 DIAGNOSIS — Q780 Osteogenesis imperfecta: Secondary | ICD-10-CM

## 2011-04-21 NOTE — Telephone Encounter (Signed)
Stanley [name], Can you please put in a referral for this patient if this is needed. ----Huntley Dec

## 2011-04-21 NOTE — Telephone Encounter (Signed)
Mr. [name] needs a wheelchair eval.  A referral needs to be sent to [facility], Phone # [phone].

## 2011-04-22 NOTE — Telephone Encounter (Signed)
Addended by: Macy Mis on: 04/22/2011 09:05 AM   Modules accepted: Orders

## 2011-04-22 NOTE — Telephone Encounter (Signed)
Spoke with Ethelene Browns and informed him that I am faxing out the referral today to [facility].

## 2011-04-22 NOTE — Telephone Encounter (Signed)
Thanks for the message Sherrilyn Rist!.  I have placed a PT referral for wheelchair eval and will fwd to white team.  Thanks!

## 2011-04-27 ENCOUNTER — Telehealth: Payer: Self-pay | Admitting: Family Medicine

## 2011-04-27 NOTE — Telephone Encounter (Signed)
Has not seen referral (needs to be signed) for his neuro rehab - can't use the [facility] one yet. Fax# [phone]

## 2011-04-27 NOTE — Telephone Encounter (Signed)
Spoke with [facility] and informed her that I was faxing over the referral again right now.Stanley [name]

## 2011-05-19 ENCOUNTER — Other Ambulatory Visit: Payer: Self-pay | Admitting: Family Medicine

## 2011-05-19 MED ORDER — OMEPRAZOLE 20 MG PO CPDR: 20.0000 mg | DELAYED_RELEASE_CAPSULE | Freq: Every day | ORAL | Status: DC

## 2011-05-21 ENCOUNTER — Telehealth: Payer: Self-pay | Admitting: Family Medicine

## 2011-05-21 MED ORDER — OMEPRAZOLE 20 MG PO CPDR: 20.0000 mg | DELAYED_RELEASE_CAPSULE | Freq: Two times a day (BID) | ORAL | Status: DC

## 2011-05-21 NOTE — Telephone Encounter (Signed)
Request completed.

## 2011-05-21 NOTE — Telephone Encounter (Signed)
The Prilosec was filled, but it was supposed to be for 2x daily so he has 1/2 of the medication needed.  Please call when this is corrected.

## 2011-06-10 ENCOUNTER — Telehealth: Payer: Self-pay | Admitting: *Deleted

## 2011-06-10 ENCOUNTER — Ambulatory Visit: Payer: Medicaid Other | Attending: Family Medicine | Admitting: Physical Therapy

## 2011-06-10 DIAGNOSIS — IMO0001 Reserved for inherently not codable concepts without codable children: Secondary | ICD-10-CM | POA: Insufficient documentation

## 2011-06-10 DIAGNOSIS — R293 Abnormal posture: Secondary | ICD-10-CM | POA: Insufficient documentation

## 2011-06-10 NOTE — Telephone Encounter (Signed)
Faxed updated rx to neurorehab

## 2011-06-15 ENCOUNTER — Encounter: Payer: Medicaid Other | Admitting: Family Medicine

## 2011-06-15 ENCOUNTER — Ambulatory Visit (INDEPENDENT_AMBULATORY_CARE_PROVIDER_SITE_OTHER): Payer: Medicaid Other | Admitting: Family Medicine

## 2011-06-15 VITALS — BP 134/94 | HR 84 | Temp 97.7°F

## 2011-06-15 DIAGNOSIS — Z0000 Encounter for general adult medical examination without abnormal findings: Secondary | ICD-10-CM

## 2011-06-15 DIAGNOSIS — J309 Allergic rhinitis, unspecified: Secondary | ICD-10-CM

## 2011-06-15 DIAGNOSIS — H543 Unqualified visual loss, both eyes: Secondary | ICD-10-CM

## 2011-06-15 DIAGNOSIS — I10 Essential (primary) hypertension: Secondary | ICD-10-CM

## 2011-06-15 DIAGNOSIS — Q780 Osteogenesis imperfecta: Secondary | ICD-10-CM

## 2011-06-15 DIAGNOSIS — R152 Fecal urgency: Secondary | ICD-10-CM

## 2011-06-15 DIAGNOSIS — K219 Gastro-esophageal reflux disease without esophagitis: Secondary | ICD-10-CM

## 2011-06-15 DIAGNOSIS — F29 Unspecified psychosis not due to a substance or known physiological condition: Secondary | ICD-10-CM

## 2011-06-15 DIAGNOSIS — Z23 Encounter for immunization: Secondary | ICD-10-CM

## 2011-06-15 DIAGNOSIS — F339 Major depressive disorder, recurrent, unspecified: Secondary | ICD-10-CM

## 2011-06-15 DIAGNOSIS — K279 Peptic ulcer, site unspecified, unspecified as acute or chronic, without hemorrhage or perforation: Secondary | ICD-10-CM

## 2011-06-15 DIAGNOSIS — H9190 Unspecified hearing loss, unspecified ear: Secondary | ICD-10-CM

## 2011-06-15 NOTE — Patient Instructions (Signed)
Let us know if you dont hear about Gi referral  Consider Audiology appointment to monitor hearing loss  Will get flu, tetanus shot today  Return for fasting labwork at your next convenience

## 2011-06-16 NOTE — Assessment & Plan Note (Signed)
Advised that I am unclear of optimal treatment with bisphosphonates.  They state his orthopedist is recommending 18 months on, 6 months off.  They decline referral to OI specialty center at this time.

## 2011-06-16 NOTE — Progress Notes (Signed)
  Subjective:    Patient ID: Stanley [name], male    DOB: April 12, 1976, 35 y.o.   MRN: [id number]  HPI 35 yo with osteogenesis imperfecta here for annual physical  Here with his mom and assistant Ethelene Browns.  We spent time reviewing OI maintenance screening and goals of care, on going needs for wheelchair assistance, and performed a full medication and history review as documented in chart.   Review of Systems Gen:  No fever, chills, unexplained weight loss Ears:  Positive for hearing loss Eyes: No vision changes, double vision, eye drainage Nose:  No rhinorrhea, congestion Throat:  No sore throat or dysphagia CV:  No chest pain, palpitations, PND, dyspnea on exertion, or edema Resp: No cough, dyspnea, wheezing Abd: No nausea, vomting,  constipation, continues to have fecal urgency MSK: no joint pain, myalgias SKIN: no rash, changing moles GU: No dysuria Neuro:  No headache, numbness, weakness, tingling, syncope.      Objective:   Physical Exam GEN: Alert & Oriented, No acute distress.  Wheelchair bound. CV:  Regular Rate & Rhythm, no murmur Respiratory:  Normal work of breathing, CTAB Abd:  + BS, soft, no tenderness to palpation Ext: no pre-tibial edema MSK:  Marked msk deformity characterized by short stature, unable to ambulate due to skeletal deformity of lower extremities.  Arms short with left contracture of elbow greater than right.        Assessment & Plan:

## 2011-06-16 NOTE — Assessment & Plan Note (Addendum)
Well controlled.  Will continue home monitoring.  Will obtain fasting bloodwork.

## 2011-06-16 NOTE — Assessment & Plan Note (Signed)
Will follow-up on referral to [facility]

## 2011-06-16 NOTE — Assessment & Plan Note (Signed)
Advised audiology follow-up given pre-existing hearing loss and high risk for progression due to OI.

## 2011-06-18 ENCOUNTER — Other Ambulatory Visit: Payer: Medicaid Other

## 2011-06-18 ENCOUNTER — Ambulatory Visit (INDEPENDENT_AMBULATORY_CARE_PROVIDER_SITE_OTHER): Payer: Medicaid Other | Admitting: *Deleted

## 2011-06-18 DIAGNOSIS — I10 Essential (primary) hypertension: Secondary | ICD-10-CM

## 2011-06-18 DIAGNOSIS — Z111 Encounter for screening for respiratory tuberculosis: Secondary | ICD-10-CM

## 2011-06-18 DIAGNOSIS — IMO0001 Reserved for inherently not codable concepts without codable children: Secondary | ICD-10-CM

## 2011-06-18 LAB — CBC WITH DIFFERENTIAL/PLATELET
Eosinophils Relative: 9 % — ABNORMAL HIGH (ref 0–5)
HCT: 37.6 % — ABNORMAL LOW (ref 39.0–52.0)
Hemoglobin: 12 g/dL — ABNORMAL LOW (ref 13.0–17.0)
Lymphocytes Relative: 34 % (ref 12–46)
Lymphs Abs: 1.9 10*3/uL (ref 0.7–4.0)
MCV: 81.2 fL (ref 78.0–100.0)
Monocytes Absolute: 0.4 10*3/uL (ref 0.1–1.0)
Neutro Abs: 2.7 10*3/uL (ref 1.7–7.7)
RBC: 4.63 MIL/uL (ref 4.22–5.81)
WBC: 5.6 10*3/uL (ref 4.0–10.5)

## 2011-06-18 LAB — COMPREHENSIVE METABOLIC PANEL
BUN: 13 mg/dL (ref 6–23)
CO2: 27 mEq/L (ref 19–32)
Calcium: 8.5 mg/dL (ref 8.4–10.5)
Creat: 0.38 mg/dL — ABNORMAL LOW (ref 0.50–1.35)
Glucose, Bld: 78 mg/dL (ref 70–99)
Total Bilirubin: 0.3 mg/dL (ref 0.3–1.2)

## 2011-06-18 LAB — LIPID PANEL
Cholesterol: 169 mg/dL (ref 0–200)
HDL: 46 mg/dL (ref >39)
LDL Cholesterol: 111 mg/dL — ABNORMAL HIGH (ref 0–99)
Triglycerides: 62 mg/dL (ref <150)

## 2011-06-18 LAB — TB SKIN TEST
Induration: 0
TB Skin Test: NEGATIVE mm

## 2011-06-18 NOTE — Progress Notes (Signed)
CMP,CBC WITH DIFF AND FLP DONE TODAY Stanley [name] 

## 2011-06-18 NOTE — Progress Notes (Signed)
PPD negative-0 mm. 

## 2011-06-21 ENCOUNTER — Encounter: Payer: Self-pay | Admitting: Family Medicine

## 2011-06-24 ENCOUNTER — Telehealth: Payer: Self-pay | Admitting: Family Medicine

## 2011-06-24 ENCOUNTER — Other Ambulatory Visit: Payer: Self-pay | Admitting: Family Medicine

## 2011-06-24 NOTE — Telephone Encounter (Signed)
Handed to Erin

## 2011-06-24 NOTE — Telephone Encounter (Signed)
Made copy of wheelchair eval from [facility] to be scanned, faxed back to them and called Ethelene Browns back to let him know everything was up front ready for pick up.

## 2011-06-24 NOTE — Telephone Encounter (Signed)
Requesting office note that discusses wheel chair evaluation, the letter MD wrote was not sufficient enough, call when ready and pt will sign ROI when he picks up office note.

## 2011-06-25 NOTE — Telephone Encounter (Signed)
Refill request

## 2011-08-09 ENCOUNTER — Other Ambulatory Visit: Payer: Self-pay | Admitting: Family Medicine

## 2011-08-10 NOTE — Telephone Encounter (Signed)
Refill request

## 2011-10-12 ENCOUNTER — Other Ambulatory Visit: Payer: Self-pay | Admitting: Family Medicine

## 2011-11-18 ENCOUNTER — Other Ambulatory Visit: Payer: Self-pay | Admitting: Family Medicine

## 2011-12-21 ENCOUNTER — Other Ambulatory Visit: Payer: Self-pay | Admitting: Family Medicine

## 2012-01-01 ENCOUNTER — Encounter (HOSPITAL_COMMUNITY): Payer: Self-pay | Admitting: Emergency Medicine

## 2012-01-01 ENCOUNTER — Emergency Department (HOSPITAL_COMMUNITY)
Admission: EM | Admit: 2012-01-01 | Discharge: 2012-01-02 | Disposition: A | Discharge status: Other Acute Inpatient Hospital | Payer: Medicare Other | Attending: Emergency Medicine | Admitting: Emergency Medicine

## 2012-01-01 DIAGNOSIS — I10 Essential (primary) hypertension: Secondary | ICD-10-CM | POA: Insufficient documentation

## 2012-01-01 DIAGNOSIS — T148XXA Other injury of unspecified body region, initial encounter: Secondary | ICD-10-CM | POA: Insufficient documentation

## 2012-01-01 DIAGNOSIS — S72002A Fracture of unspecified part of neck of left femur, initial encounter for closed fracture: Secondary | ICD-10-CM

## 2012-01-01 DIAGNOSIS — Q780 Osteogenesis imperfecta: Secondary | ICD-10-CM | POA: Insufficient documentation

## 2012-01-01 DIAGNOSIS — W050XXA Fall from non-moving wheelchair, initial encounter: Secondary | ICD-10-CM | POA: Insufficient documentation

## 2012-01-01 DIAGNOSIS — IMO0002 Reserved for concepts with insufficient information to code with codable children: Secondary | ICD-10-CM

## 2012-01-01 DIAGNOSIS — S72009A Fracture of unspecified part of neck of unspecified femur, initial encounter for closed fracture: Secondary | ICD-10-CM | POA: Insufficient documentation

## 2012-01-01 DIAGNOSIS — H5789 Other specified disorders of eye and adnexa: Secondary | ICD-10-CM | POA: Insufficient documentation

## 2012-01-01 DIAGNOSIS — R220 Localized swelling, mass and lump, head: Secondary | ICD-10-CM | POA: Insufficient documentation

## 2012-01-01 DIAGNOSIS — S0180XA Unspecified open wound of other part of head, initial encounter: Secondary | ICD-10-CM | POA: Insufficient documentation

## 2012-01-01 DIAGNOSIS — M5382 Other specified dorsopathies, cervical region: Secondary | ICD-10-CM | POA: Insufficient documentation

## 2012-01-01 NOTE — ED Notes (Signed)
Pt transported from group home after falling while transferring from toilet to [facility]. Denies LOC, lac noted above R eye. A & O.

## 2012-01-01 NOTE — ED Notes (Signed)
Pt states while attempting to flush toilet, fell forward off toilet hitting floor. Pt has severely shortened extremities, c/o pain to L hip area, painful movement. Lac noted above R eye, bleeding controlled. Pts R eye swollen nearly closed and purple in color. Pt denies LOC, A & O at this time.

## 2012-01-01 NOTE — ED Notes (Signed)
JYN:[id number]<AO> Expected date:01/01/12<BR> Expected time:11:04 PM<BR> Means of arrival:Ambulance<BR> Comments:<BR> Laceration, fall, hip pain.

## 2012-01-02 ENCOUNTER — Emergency Department (HOSPITAL_COMMUNITY): Payer: Medicare Other

## 2012-01-02 LAB — CBC
HCT: 32 % — ABNORMAL LOW (ref 39.0–52.0)
Hemoglobin: 10.2 g/dL — ABNORMAL LOW (ref 13.0–17.0)
RBC: 4.38 MIL/uL (ref 4.22–5.81)
RDW: 16 % — ABNORMAL HIGH (ref 11.5–15.5)
WBC: 14 10*3/uL — ABNORMAL HIGH (ref 4.0–10.5)

## 2012-01-02 LAB — COMPREHENSIVE METABOLIC PANEL
Albumin: 3.6 g/dL (ref 3.5–5.2)
Alkaline Phosphatase: 75 U/L (ref 39–117)
BUN: 15 mg/dL (ref 6–23)
CO2: 25 mEq/L (ref 19–32)
Chloride: 95 mEq/L — ABNORMAL LOW (ref 96–112)
GFR calc non Af Amer: >90 mL/min (ref >90)
Glucose, Bld: 108 mg/dL — ABNORMAL HIGH (ref 70–99)
Potassium: 3.8 mEq/L (ref 3.5–5.1)
Total Bilirubin: 0.2 mg/dL — ABNORMAL LOW (ref 0.3–1.2)

## 2012-01-02 MED ORDER — HYDROCODONE-ACETAMINOPHEN 5-325 MG PO TABS
1.0000 | ORAL_TABLET | Freq: Once | ORAL | Status: AC
  Administered 2012-01-02: 1 via ORAL
  Filled 2012-01-02: qty 1

## 2012-01-02 MED ORDER — ONDANSETRON HCL 4 MG/2ML IJ SOLN
4.0000 mg | Freq: Once | INTRAMUSCULAR | Status: AC
  Administered 2012-01-02: 4 mg via INTRAVENOUS
  Filled 2012-01-02: qty 2

## 2012-01-02 MED ORDER — TETANUS-DIPHTH-ACELL PERTUSSIS 5-2.5-18.5 LF-MCG/0.5 IM SUSP: 0.5000 mL | Freq: Once | INTRAMUSCULAR | Status: DC

## 2012-01-02 MED ORDER — FENTANYL CITRATE 0.05 MG/ML IJ SOLN
50.0000 ug | Freq: Once | INTRAMUSCULAR | Status: AC
  Administered 2012-01-02: 50 ug via INTRAVENOUS
  Filled 2012-01-02: qty 2

## 2012-01-02 NOTE — ED Notes (Signed)
Report called to [facility], Forensic scientist [facility] ED. [facility] called for transport

## 2012-01-02 NOTE — ED Provider Notes (Signed)
History     CSN: [id number]  Arrival date & time 01/01/12  2323   First MD Initiated Contact with Patient 01/02/12 0000      Chief Complaint  Patient presents with  . Fall    (Consider location/radiation/quality/duration/timing/severity/associated sxs/prior treatment) The history is provided by the patient.   history of osteogenesis imperfecta, is wheelchair-bound. Tonight transferring from wheelchair after using the bathroom and fell injuring left hip and striking right for head sustaining laceration right for head and bruising over right eye. No LOC. No neck pain. No new weakness or numbness. Main complaint is left hip pain. Pain is sharp in quality, moderate severity and not radiating from left hip or right for head. Hurts to move left leg otherwise no alleviating factors.  Past Medical History  Diagnosis Date  . Asperger's disorder   . Osteogenesis imperfecta   . Peptic ulcer   . Depression   . Allergic rhinitis   . Hypertension   . GERD (gastroesophageal reflux disease)   . Psychosis   . Seborrheic dermatitis   . Abnormal PFTs   . Iron deficiency anemia   . Hiatal hernia     Past Surgical History  Procedure Date  . Distraction osteogenesis     22 surgeries for osetogenesis imperfecta per patient-rods placed    Family History  Problem Relation Age of Onset  . Adopted: Yes    History  Substance Use Topics  . Smoking status: Former Smoker    Quit date: 06/04/2004  . Smokeless tobacco: Never Used  . Alcohol Use: Yes     occasional      Review of Systems  Constitutional: Negative for fever and chills.  HENT: Negative for neck pain and neck stiffness.   Eyes: Negative for pain.  Respiratory: Negative for shortness of breath.   Cardiovascular: Negative for chest pain.  Gastrointestinal: Negative for abdominal pain.  Genitourinary: Negative for flank pain.  Musculoskeletal: Negative for back pain.  Skin: Positive for wound. Negative for rash.    Neurological: Negative for headaches.  All other systems reviewed and are negative.    Allergies  Aspirin and Nsaids  Home Medications   Current Outpatient Rx  Name Route Sig Dispense Refill  . ALBUTEROL 90 MCG/ACT IN AERS Inhalation Inhale 1-2 puffs into the lungs every 4 (four) hours as needed.      . ALENDRONATE SODIUM 35 MG PO TABS  1 TAB BY MOUTH WEEKLY. SIT UPRIGHT 30 MINUTES AFTER TAKING. 4 tablet 11  . AMLODIPINE BESYLATE 5 MG PO TABS  TAKE ONE TABLET DAILY 30 tablet 11  . ARIPIPRAZOLE 20 MG PO TABS Oral Take 20 mg by mouth daily.      Marland Kitchen CETIRIZINE HCL 10 MG PO TABS  TAKE 1 TABLET BY MOUTH EVERY DAY 90 tablet 2  . CITALOPRAM HYDROBROMIDE 20 MG PO TABS Oral Take 20 mg by mouth daily.      . METHYLPHENIDATE HCL ER 36 MG PO TBCR Oral Take 36 mg by mouth daily. Prescribed by Dr. Marcelle Overlie     . METOPROLOL TARTRATE 25 MG PO TABS  TAKE 1 TABLET BY MOUTH TWICE A DAY FOR BLOOD PRESSURE 60 tablet 6  . ONE-DAILY MULTI VITAMINS PO TABS Oral Take 1 tablet by mouth daily.      Marland Kitchen OMEPRAZOLE 20 MG PO CPDR Oral Take 1 capsule (20 mg total) by mouth 2 (two) times daily. 60 capsule 11    BP 120/62  Pulse 103  Temp 98.3 F (36.8  C) (Oral)  Resp 18  Wt 60 lb (27.216 kg)  SpO2 97%  Physical Exam  Constitutional: He is oriented to person, place, and time. He appears well-developed and well-nourished.  HENT:  Head: Normocephalic.       3 cm laceration right for head lateral to right eye moderate gape without active bleeding. There is significant periorbital swelling and ecchymosis with right eye clear - no hyphema and vision intact. No other facial tenderness or trauma  Eyes: Conjunctivae and EOM are normal. Pupils are equal, round, and reactive to light.  Neck: Trachea normal.       No cervical spine tenderness or palpable deformity  Cardiovascular: Normal rate, regular rhythm, S1 normal, S2 normal and normal pulses.     No systolic murmur is present   No diastolic murmur is present   Pulses:      Radial pulses are 2+ on the right side, and 2+ on the left side.  Pulmonary/Chest: Effort normal and breath sounds normal. He has no wheezes. He has no rhonchi. He has no rales. He exhibits no tenderness.  Abdominal: Soft. Normal appearance and bowel sounds are normal. There is no tenderness. There is no CVA tenderness and negative Murphy's sign.  Musculoskeletal:       Tender over left hip with distal neurovascular intact. Skin intact throughout extremities  Neurological: He is alert and oriented to person, place, and time. He has normal strength. No cranial nerve deficit or sensory deficit. GCS eye subscore is 4. GCS verbal subscore is 5. GCS motor subscore is 6.  Skin: Skin is warm and dry. No rash noted. He is not diaphoretic.  Psychiatric: His speech is normal.       Cooperative and appropriate    ED Course  LACERATION REPAIR Date/Time: 01/02/2012 3:17 AM Performed by: Sunnie Nielsen Authorized by: Sunnie Nielsen Consent: Verbal consent obtained. Risks and benefits: risks, benefits and alternatives were discussed Consent given by: patient Patient understanding: patient states understanding of the procedure being performed Patient consent: the patient's understanding of the procedure matches consent given Procedure consent: procedure consent matches procedure scheduled Required items: required blood products, implants, devices, and special equipment available Patient identity confirmed: verbally with patient Time out: Immediately prior to procedure a "time out" was called to verify the correct patient, procedure, equipment, support staff and site/side marked as required. Body area: head/neck Location details: forehead Laceration length: 3 cm Tendon involvement: none Nerve involvement: none Vascular damage: no Anesthesia: local infiltration Local anesthetic: lidocaine 1% without epinephrine Anesthetic total: 2 ml Preparation: Patient was prepped and draped in the usual  sterile fashion. Irrigation solution: saline Amount of cleaning: extensive Debridement: none Skin closure: 6-0 Prolene Number of sutures: 4 Technique: simple Approximation: close Approximation difficulty: simple Dressing: antibiotic ointment Patient tolerance: Patient tolerated the procedure well with no immediate complications.   (including critical care time)   Labs Reviewed  CBC  COMPREHENSIVE METABOLIC PANEL   Dg Hip Complete Left  01/02/2012  *RADIOLOGY REPORT*  Clinical Data: Hip pain status post fall.  Osteogenesis imperfecta.  LEFT HIP - COMPLETE 2+ VIEW  Comparison: None.  Findings: There is marked deformity to the pelvis and femurs in keeping with sequelae of osteogenesis imperfecta and multiple prior fractures.  The patient is status post lateral plate and screw fixation of the left femoral shaft.  There is a fracture of the left femoral neck that is either acute or subacute.  Diffuse osteopenia.  IMPRESSION: Lucency through the left femoral neck  is in keeping with acute or subacute fracture.  Diffuse osteopenia and osseous deformities, in keeping with sequelae of prior fractures/osteogenesis imperfecta.  Original Report Authenticated By: Waneta Martins, M.D.   Ct Head Wo Contrast  01/02/2012  *RADIOLOGY REPORT*  Clinical Data: Right eye laceration status post fall.  CT HEAD WITHOUT CONTRAST,CT CERVICAL SPINE WITHOUT CONTRAST  Technique:  Contiguous axial images were obtained from the base of the skull through the vertex without contrast.,Technique: Multidetector CT imaging of the cervical spine was performed. Multiplanar CT image reconstructions were also generated.  Comparison: 02/27/2011  Findings:  Head:  Compared to the prior examination, the prominence of the sulci and ventricular system is similar.  No definite evidence for abnormal extra-axial fluid collection, hemorrhage, mass, mass effect, or acute infarction.  There is right preseptal soft tissue swelling.  The globes  are symmetric and lenses are located.  No retrobulbar hematoma.  Irregular contour to the calvarium and postoperative changes at the vertex, similar to prior. There is a nondisplaced fracture through the right superior orbital wall and right frontal bone.  Chronic opacification of the maxillary sinuses.  Cervical spine:  Maintained craniocervical and C1-2 articulation. There is vertebral body shape irregularity and multilevel facet fusion. Evidence of prior C1 fractures posteriorly.  No displaced acute fracture identified.  IMPRESSION: Right preseptal soft tissue swelling.  Nondisplaced right superior orbital wall and frontal bone fracture.  Otherwise chronic changes of volume loss and deformity of the calvarium in keeping with sequelae of osteogenesis imperfecta.  Multilevel vertebral body irregularity, posterior fusion, and sequelae of prior fractures.  No definite acute fracture of the cervical spine identified.  Original Report Authenticated By: Waneta Martins, M.D.   Ct Cervical Spine Wo Contrast  01/02/2012  *RADIOLOGY REPORT*  Clinical Data: Right eye laceration status post fall.  CT HEAD WITHOUT CONTRAST,CT CERVICAL SPINE WITHOUT CONTRAST  Technique:  Contiguous axial images were obtained from the base of the skull through the vertex without contrast.,Technique: Multidetector CT imaging of the cervical spine was performed. Multiplanar CT image reconstructions were also generated.  Comparison: 02/27/2011  Findings:  Head:  Compared to the prior examination, the prominence of the sulci and ventricular system is similar.  No definite evidence for abnormal extra-axial fluid collection, hemorrhage, mass, mass effect, or acute infarction.  There is right preseptal soft tissue swelling.  The globes are symmetric and lenses are located.  No retrobulbar hematoma.  Irregular contour to the calvarium and postoperative changes at the vertex, similar to prior. There is a nondisplaced fracture through the right  superior orbital wall and right frontal bone.  Chronic opacification of the maxillary sinuses.  Cervical spine:  Maintained craniocervical and C1-2 articulation. There is vertebral body shape irregularity and multilevel facet fusion. Evidence of prior C1 fractures posteriorly.  No displaced acute fracture identified.  IMPRESSION: Right preseptal soft tissue swelling.  Nondisplaced right superior orbital wall and frontal bone fracture.  Otherwise chronic changes of volume loss and deformity of the calvarium in keeping with sequelae of osteogenesis imperfecta.  Multilevel vertebral body irregularity, posterior fusion, and sequelae of prior fractures.  No definite acute fracture of the cervical spine identified.  Original Report Authenticated By: Waneta Martins, M.D.    Wound repair. Pain control. Imaging obtained as above and reviewed with patient. For hip fracture, patient prefers to [facility]. Dr. Effie Shy has done previous surgeries and patient wishes to go there.  3:27 AM consult orthopedics at [facility]. Case discussed with Dr. Emelda Fear  as above and she accepts patient in transfer to [facility] ED. PALS line nurse agrees to notify the emergency department the patient will be arriving there.   MDM   36 year old male with osteogenesis imperfecta and left hip fracture after fall. No intracranial bleed or neck fracture by clinical evaluation or imaging as above. Requesting transfer to [facility] for orthopedic surgery evaluation.       Sunnie Nielsen, MD 01/02/12 [phone]

## 2012-01-02 NOTE — ED Notes (Signed)
[facility] at bedside for transport. 

## 2012-01-12 ENCOUNTER — Ambulatory Visit (INDEPENDENT_AMBULATORY_CARE_PROVIDER_SITE_OTHER): Payer: Medicare Other | Admitting: Family Medicine

## 2012-01-12 ENCOUNTER — Encounter: Payer: Self-pay | Admitting: Family Medicine

## 2012-01-12 VITALS — Ht <= 58 in | Wt <= 1120 oz

## 2012-01-12 DIAGNOSIS — W050XXA Fall from non-moving wheelchair, initial encounter: Secondary | ICD-10-CM

## 2012-01-12 DIAGNOSIS — W19XXXA Unspecified fall, initial encounter: Secondary | ICD-10-CM

## 2012-01-12 DIAGNOSIS — T148XXA Other injury of unspecified body region, initial encounter: Secondary | ICD-10-CM

## 2012-01-12 HISTORY — DX: Other injury of unspecified body region, initial encounter: T14.8XXA

## 2012-01-12 NOTE — Patient Instructions (Addendum)
Consider following up with audiology for hearing loss  I don't have a good answer about bisphosphonate treatment for OI.  If I can help you find an OI provider, please let me know.  Follow-up in 6 months or sooner

## 2012-01-12 NOTE — Assessment & Plan Note (Signed)
Doing well with pain.  Does not limit him much from baseline except for having to wear cervical collar.  Agreed with transitioning from percocet to Vicodin per orthopedist.  Lovenox sounds reasonable even though his mobility is unchanged.   Discussed that would need to seek OI as I cannot answer question about option treatment with bisphosphonate with his condition.  Is safer now- has new wheelchair with seatbelt.  Will continue to follow-up with ortho, I will see him for physical in 6 months

## 2012-01-12 NOTE — Progress Notes (Signed)
  Subjective:    Patient ID: Stanley [name], male    DOB: 1976/02/09, 36 y.o.   MRN: [id number]  HPI Patient here for hospital follow-up  Larey Seat out of wheelchair near toilet.  Was seen at [facility] 6/20 and transferred to [facility] for letf hip fracture.  Was later found to have skull fracture with forehead laceration and cervical fracture.  Patient was placed on vicodin and percocet, Lovenox, and discharged several days ago.  They opted for non-operative treatment due to his history of OI. I am awaiting report from [facility].  States that pain is well controlled. On percocet.  Is planning on picking up vicodin. No constipation or somnolence.   Review of Systems see HPI     Objective:   Physical Exam GEN: Alert & Oriented, No acute distress.  Small stature, wheelchair bound wearing a cervical collar CV:  Regular Rate & Rhythm, no murmur Respiratory:  Normal work of breathing, CTAB Abd:  + BS, soft, no tenderness to palpation Skin:  Laceration on right forehead well healing.        Assessment & Plan:

## 2012-06-07 ENCOUNTER — Other Ambulatory Visit: Payer: Self-pay | Admitting: Family Medicine

## 2012-07-20 ENCOUNTER — Other Ambulatory Visit: Payer: Self-pay | Admitting: Family Medicine

## 2012-08-08 ENCOUNTER — Ambulatory Visit (INDEPENDENT_AMBULATORY_CARE_PROVIDER_SITE_OTHER): Payer: Medicare Other | Admitting: Family Medicine

## 2012-08-08 VITALS — BP 111/77 | HR 92 | Temp 98.5°F

## 2012-08-08 DIAGNOSIS — F29 Unspecified psychosis not due to a substance or known physiological condition: Secondary | ICD-10-CM

## 2012-08-08 DIAGNOSIS — F339 Major depressive disorder, recurrent, unspecified: Secondary | ICD-10-CM

## 2012-08-08 DIAGNOSIS — I10 Essential (primary) hypertension: Secondary | ICD-10-CM

## 2012-08-08 DIAGNOSIS — Z0000 Encounter for general adult medical examination without abnormal findings: Secondary | ICD-10-CM

## 2012-08-08 DIAGNOSIS — Q780 Osteogenesis imperfecta: Secondary | ICD-10-CM

## 2012-08-08 DIAGNOSIS — Z23 Encounter for immunization: Secondary | ICD-10-CM

## 2012-08-08 NOTE — Assessment & Plan Note (Signed)
Doing well today.

## 2012-08-08 NOTE — Assessment & Plan Note (Signed)
Discussed routine monitoring.  Mom will let me know if she needs any further assistance

## 2012-08-08 NOTE — Patient Instructions (Addendum)
Follow-up yearly, or sooner if needed!  I will forward fasting labwork to [facility].  If she does not receive, she can call office

## 2012-08-08 NOTE — Progress Notes (Signed)
  Subjective:    Patient ID: Stanley [name], male    DOB: [birth year], 37 y.o.   MRN: [id number]  HPI  Here for annual physical  No new fractures or health concerns since last seen.  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      Review of Systems See hpi    Objective:   Physical ExamHere with his mother and medical assistant- tony  GEN: Alert & Oriented, No acute distress, small stature, short limbs due to OI.  In wheelchair HEENT: [place]/AT. EOMI, PERRLA, no conjunctival injection or scleral icterus.  Soft cerumen bilaterally  .  Nares without edema or rhinorrhea.  Oropharynx is without erythema or exudates.  No anterior or posterior cervical lymphadenopathy. CV:  Regular Rate & Rhythm, no murmur Respiratory:  Normal work of breathing, CTAB Abd:  + BS, soft, no tenderness to palpation Ext: no pre-tibial edema        Assessment & Plan:  Flu shot given today

## 2012-08-10 ENCOUNTER — Other Ambulatory Visit: Payer: Self-pay | Admitting: Family Medicine

## 2012-08-10 ENCOUNTER — Other Ambulatory Visit: Payer: Medicare Other

## 2012-08-10 DIAGNOSIS — I10 Essential (primary) hypertension: Secondary | ICD-10-CM

## 2012-08-10 LAB — LIPID PANEL
HDL: 47 mg/dL (ref >39)
LDL Cholesterol: 94 mg/dL (ref 0–99)
Triglycerides: 103 mg/dL (ref <150)
VLDL: 21 mg/dL (ref 0–40)

## 2012-08-10 LAB — CBC WITH DIFFERENTIAL/PLATELET
Basophils Absolute: 0 10*3/uL (ref 0.0–0.1)
Basophils Relative: 1 % (ref 0–1)
MCHC: 29.4 g/dL — ABNORMAL LOW (ref 30.0–36.0)
Neutro Abs: 2.6 10*3/uL (ref 1.7–7.7)
Neutrophils Relative %: 51 % (ref 43–77)
RDW: 17.8 % — ABNORMAL HIGH (ref 11.5–15.5)

## 2012-08-10 LAB — COMPREHENSIVE METABOLIC PANEL
Alkaline Phosphatase: 81 U/L (ref 39–117)
Glucose, Bld: 76 mg/dL (ref 70–99)
Sodium: 141 mEq/L (ref 135–145)
Total Bilirubin: 0.3 mg/dL (ref 0.3–1.2)
Total Protein: 7.3 g/dL (ref 6.0–8.3)

## 2012-08-10 NOTE — Progress Notes (Signed)
CMP,CBC WITH DIFF AND FLP DONE TODAY Tracker Mance

## 2012-08-11 LAB — ANEMIA PANEL
Ferritin: 2 ng/mL — ABNORMAL LOW (ref 22–322)
Folate: >20 ng/mL
RBC.: 4.45 MIL/uL (ref 4.22–5.81)
Retic Ct Pct: 1.1 % (ref 0.4–2.3)

## 2012-08-14 ENCOUNTER — Encounter: Payer: Self-pay | Admitting: Family Medicine

## 2012-08-14 ENCOUNTER — Telehealth (INDEPENDENT_AMBULATORY_CARE_PROVIDER_SITE_OTHER): Payer: Medicare Other | Admitting: Family Medicine

## 2012-08-14 DIAGNOSIS — D509 Iron deficiency anemia, unspecified: Secondary | ICD-10-CM | POA: Insufficient documentation

## 2012-08-14 MED ORDER — FERROUS SULFATE 324 (65 FE) MG PO TBEC: 1.0000 | DELAYED_RELEASE_TABLET | Freq: Two times a day (BID) | ORAL | Status: DC

## 2012-08-14 NOTE — Telephone Encounter (Signed)
Spoke with mom on plan-  Labs indicate iron deficiency anemia.  In past has been due to gastritis.  Patient symptoms well controlled on high dose PPI.  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 Russella Dar.  Discussed options for workup- patient will pick up stool cards, start twice daily iron supplement.  She will make appointment with GI at [facility], if any difficulty, will let me know the need for a new referral.

## 2012-08-14 NOTE — Telephone Encounter (Signed)
Discussed with patient iron deficiency anemia seen on labwork.  He asked I discuss furtehr with his mother, Mardelle Matte at [phone].  Il eft a message on voicemail

## 2012-08-14 NOTE — Assessment & Plan Note (Signed)
Labs indicate iron deficiency anemia.  In past has been due to gastritis.  Patient symptoms well controlled on high dose PPI.  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 Russella Dar.  Discussed options for workup- patient will pick up stool cards, start twice daily iron supplement.  She will make appointment with GI at [facility], if any difficulty, will let me know the need for a new referral.

## 2012-08-14 NOTE — Telephone Encounter (Signed)
Mom returning call to Dr. Earnest Bailey.

## 2012-09-11 LAB — POC HEMOCCULT BLD/STL (HOME/3-CARD/SCREEN): Fecal Occult Blood, POC: POSITIVE

## 2012-09-11 NOTE — Addendum Note (Signed)
Addended by: [place], BONNIE on: 09/11/2012 05:53 PM   Modules accepted: Orders

## 2012-09-13 ENCOUNTER — Encounter: Payer: Self-pay | Admitting: Family Medicine

## 2012-09-21 NOTE — Telephone Encounter (Signed)
Discussed with patient.  He wanted to me to call his caregiver Ethelene Browns [phone] and I left a message  Explained to keep appt with GI, continue taking iron twice a day.  Will work on continuing to identify source.  Asked him to let me know if has any symptoms of bleeding or pain in the meantime.

## 2012-09-21 NOTE — Telephone Encounter (Signed)
Has an appt sched w/ [facility] in late April - wants to know what they can do in the mean time as far as blood in his stool pls leave VM for him to call back - he will be in class

## 2012-09-25 ENCOUNTER — Other Ambulatory Visit: Payer: Self-pay | Admitting: Family Medicine

## 2012-10-23 ENCOUNTER — Telehealth: Payer: Self-pay | Admitting: Family Medicine

## 2012-10-23 NOTE — Telephone Encounter (Signed)
Please tell patient- we cannot order tests for outside physicians- it is best that they have it done by the ordering phsyician so the correct test and results go back to his GI.  He can discuss with his GI if he would write order for [facility] to do it

## 2012-10-23 NOTE — Telephone Encounter (Signed)
Was seen by [facility] today and was told he needed a barium swallow test - they have the orders written but want to have it done in [place].  Wants to know who they should go to for this.

## 2012-10-23 NOTE — Telephone Encounter (Signed)
Spoke with Kathie Rhodes and he will call GI and have them order the scan and schedule it at [facility].

## 2012-10-23 NOTE — Telephone Encounter (Signed)
We can schedule these @ [facility] but will need an order placed in Epic. Rahel Carlton, Maryjo Rochester

## 2012-11-09 ENCOUNTER — Other Ambulatory Visit (HOSPITAL_COMMUNITY): Payer: Self-pay | Admitting: Internal Medicine

## 2012-11-09 DIAGNOSIS — K219 Gastro-esophageal reflux disease without esophagitis: Secondary | ICD-10-CM

## 2012-11-13 ENCOUNTER — Other Ambulatory Visit (HOSPITAL_COMMUNITY): Payer: Medicare Other

## 2012-11-19 ENCOUNTER — Other Ambulatory Visit: Payer: Self-pay | Admitting: Family Medicine

## 2012-11-20 ENCOUNTER — Inpatient Hospital Stay (HOSPITAL_COMMUNITY): Admission: RE | Admit: 2012-11-20 | Payer: Medicare Other | Source: Ambulatory Visit

## 2012-12-11 ENCOUNTER — Other Ambulatory Visit (HOSPITAL_COMMUNITY): Payer: Self-pay | Admitting: Internal Medicine

## 2012-12-11 ENCOUNTER — Ambulatory Visit (HOSPITAL_COMMUNITY)
Admission: RE | Admit: 2012-12-11 | Discharge: 2012-12-11 | Disposition: A | Discharge status: Home / Self Care | Payer: Medicare Other | Source: Ambulatory Visit | Attending: Internal Medicine | Admitting: Internal Medicine

## 2012-12-11 DIAGNOSIS — K449 Diaphragmatic hernia without obstruction or gangrene: Secondary | ICD-10-CM | POA: Insufficient documentation

## 2012-12-11 DIAGNOSIS — Q780 Osteogenesis imperfecta: Secondary | ICD-10-CM | POA: Insufficient documentation

## 2012-12-11 DIAGNOSIS — K219 Gastro-esophageal reflux disease without esophagitis: Secondary | ICD-10-CM

## 2013-02-16 ENCOUNTER — Other Ambulatory Visit: Payer: Self-pay | Admitting: Family Medicine

## 2013-02-23 ENCOUNTER — Telehealth: Payer: Self-pay | Admitting: Family Medicine

## 2013-02-23 NOTE — Telephone Encounter (Signed)
Pt would like to referral for a  [facility] dermatologist. His medical guardian is wanting this because of the medication he is taking is causing him to break out. Please call her at  [phone] [facility]

## 2013-02-23 NOTE — Telephone Encounter (Signed)
Will forward to MD. Onie Kasparek,CMA  

## 2013-02-26 ENCOUNTER — Telehealth: Payer: Self-pay | Admitting: Family Medicine

## 2013-02-26 NOTE — Telephone Encounter (Signed)
Have patient see me for referral.

## 2013-02-26 NOTE — Telephone Encounter (Signed)
Caregiver brought in dietary restrictions plan to be signed off by Dr Gretta Arab

## 2013-02-26 NOTE — Telephone Encounter (Signed)
Attempted to call again - no answer. Elizabeth Alicyn Klann, RN-BSN  

## 2013-02-26 NOTE — Telephone Encounter (Signed)
Was this placed in her box to be signed?  Meko Masterson,CMA

## 2013-02-26 NOTE — Telephone Encounter (Signed)
Left message for pt to call back.  Please schedule an appt with his PCP in order for him to get referral.  Thanks [facility]

## 2013-02-27 NOTE — Telephone Encounter (Signed)
Attempted to call caregiver - form complete and signed - placed up front Wyatt Haste, RN-BSN

## 2013-03-03 ENCOUNTER — Other Ambulatory Visit: Payer: Self-pay | Admitting: Family Medicine

## 2013-03-06 ENCOUNTER — Other Ambulatory Visit: Payer: Self-pay | Admitting: Family Medicine

## 2013-03-26 ENCOUNTER — Telehealth: Payer: Self-pay | Admitting: Family Medicine

## 2013-03-26 NOTE — Telephone Encounter (Signed)
I got a request to complete Psychologic testing request form, I have never met patient, and I do not know his psychologic or mental status. Patient need to be seen. I will forward message to blue team for scheduling.  Please schedule f/u with me to complete Psychologic testing request form.

## 2013-03-26 NOTE — Telephone Encounter (Signed)
Unable to LMOVM, will await callback. Fleeger, Maryjo Rochester

## 2013-06-09 ENCOUNTER — Other Ambulatory Visit: Payer: Self-pay | Admitting: Family Medicine

## 2013-06-11 NOTE — Telephone Encounter (Signed)
prescription filled one time,please inform patient he needs follow up for subsequent refill.

## 2013-06-20 ENCOUNTER — Other Ambulatory Visit: Payer: Self-pay | Admitting: Family Medicine

## 2013-06-26 ENCOUNTER — Other Ambulatory Visit: Payer: Self-pay | Admitting: Family Medicine

## 2013-08-08 ENCOUNTER — Other Ambulatory Visit: Payer: Self-pay | Admitting: Family Medicine

## 2013-08-08 ENCOUNTER — Encounter: Payer: Self-pay | Admitting: Family Medicine

## 2013-08-08 NOTE — Telephone Encounter (Signed)
Patient need appointment for medication refill, last visit to our clinic was 08/2012. 1 month supply of his medication given this time pending his follow up.Please help patient schedule f/u appointment.

## 2013-08-08 NOTE — Progress Notes (Signed)
Patient ID: Stanley [name], male   DOB: 1976/02/11, 38 y.o.   MRN: [id number] Patient need appointment for medication refill, last visit to our clinic was 08/2012. 1 month supply of his medication given this time pending his follow up.Please help patient schedule f/u appointment.

## 2013-08-08 NOTE — Telephone Encounter (Signed)
appt 08/14/13. Stanley [name], Stanley [name]

## 2013-08-14 ENCOUNTER — Ambulatory Visit (INDEPENDENT_AMBULATORY_CARE_PROVIDER_SITE_OTHER): Payer: Medicare Other | Admitting: Family Medicine

## 2013-08-14 ENCOUNTER — Encounter: Payer: Self-pay | Admitting: Family Medicine

## 2013-08-14 VITALS — BP 126/90 | HR 92 | Temp 97.4°F | Wt 71.5 lb

## 2013-08-14 DIAGNOSIS — D649 Anemia, unspecified: Secondary | ICD-10-CM

## 2013-08-14 DIAGNOSIS — Z23 Encounter for immunization: Secondary | ICD-10-CM

## 2013-08-14 DIAGNOSIS — Z0000 Encounter for general adult medical examination without abnormal findings: Secondary | ICD-10-CM

## 2013-08-14 DIAGNOSIS — D509 Iron deficiency anemia, unspecified: Secondary | ICD-10-CM

## 2013-08-14 LAB — CBC WITH DIFFERENTIAL/PLATELET
BASOS PCT: 1 % (ref 0–1)
Basophils Absolute: 0 10*3/uL (ref 0.0–0.1)
Eosinophils Absolute: 0.4 10*3/uL (ref 0.0–0.7)
Eosinophils Relative: 6 % — ABNORMAL HIGH (ref 0–5)
HCT: 48 % (ref 39.0–52.0)
HEMOGLOBIN: 16.5 g/dL (ref 13.0–17.0)
LYMPHS ABS: 1.6 10*3/uL (ref 0.7–4.0)
LYMPHS PCT: 26 % (ref 12–46)
MCH: 31.1 pg (ref 26.0–34.0)
MCHC: 34.4 g/dL (ref 30.0–36.0)
MCV: 90.6 fL (ref 78.0–100.0)
MONOS PCT: 9 % (ref 3–12)
Monocytes Absolute: 0.6 10*3/uL (ref 0.1–1.0)
NEUTROS ABS: 3.7 10*3/uL (ref 1.7–7.7)
NEUTROS PCT: 58 % (ref 43–77)
Platelets: 205 10*3/uL (ref 150–400)
RBC: 5.3 MIL/uL (ref 4.22–5.81)
RDW: 14 % (ref 11.5–15.5)
WBC: 6.2 10*3/uL (ref 4.0–10.5)

## 2013-08-14 LAB — BASIC METABOLIC PANEL
BUN: 16 mg/dL (ref 6–23)
CO2: 26 meq/L (ref 19–32)
Calcium: 9.5 mg/dL (ref 8.4–10.5)
Chloride: 103 mEq/L (ref 96–112)
Creat: 0.32 mg/dL — ABNORMAL LOW (ref 0.50–1.35)
GLUCOSE: 100 mg/dL — AB (ref 70–99)
POTASSIUM: 4.5 meq/L (ref 3.5–5.3)
SODIUM: 140 meq/L (ref 135–145)

## 2013-08-14 NOTE — Patient Instructions (Signed)
Anemia, Nonspecific Anemia is a condition in which the concentration of red blood cells or hemoglobin in the blood is below normal. Hemoglobin is a substance in red blood cells that carries oxygen to the tissues of the body. Anemia results in not enough oxygen reaching these tissues.  CAUSES  Common causes of anemia include:   Excessive bleeding. Bleeding may be internal or external. This includes excessive bleeding from periods (in women) or from the intestine.   Poor nutrition.   Chronic kidney, thyroid, and liver disease.  Bone marrow disorders that decrease red blood cell production.  Cancer and treatments for cancer.  HIV, AIDS, and their treatments.  Spleen problems that increase red blood cell destruction.  Blood disorders.  Excess destruction of red blood cells due to infection, medicines, and autoimmune disorders. SIGNS AND SYMPTOMS   Minor weakness.   Dizziness.   Headache.  Palpitations.   Shortness of breath, especially with exercise.   Paleness.  Cold sensitivity.  Indigestion.  Nausea.  Difficulty sleeping.  Difficulty concentrating. Symptoms may occur suddenly or they may develop slowly.  DIAGNOSIS  Additional blood tests are often needed. These help your health care provider determine the best treatment. Your health care provider will check your stool for blood and look for other causes of blood loss.  TREATMENT  Treatment varies depending on the cause of the anemia. Treatment can include:   Supplements of iron, vitamin X77, or folic acid.   Hormone medicines.   A blood transfusion. This may be needed if blood loss is severe.   Hospitalization. This may be needed if there is significant continual blood loss.   Dietary changes.  Spleen removal. HOME CARE INSTRUCTIONS Keep all follow-up appointments. It often takes many weeks to correct anemia, and having your health care provider check on your condition and your response to  treatment is very important. SEEK IMMEDIATE MEDICAL CARE IF:   You develop extreme weakness, shortness of breath, or chest pain.   You become dizzy or have trouble concentrating.  You develop heavy vaginal bleeding.   You develop a rash.   You have bloody or black, tarry stools.   You faint.   You vomit up blood.   You vomit repeatedly.   You have abdominal pain.  You have a fever or persistent symptoms for more than 2 3 days.   You have a fever and your symptoms suddenly get worse.   You are dehydrated.  MAKE SURE YOU:  Understand these instructions.  Will watch your condition.  Will get help right away if you are not doing well or get worse. Document Released: 07/29/2004 Document Revised: 02/21/2013 Document Reviewed: 12/15/2012 [facility] Patient Information 2014 [place].

## 2013-08-14 NOTE — Progress Notes (Signed)
Patient ID: Stanley [name], male   DOB: 16-May-1976, 38 y.o.   MRN: [id number] Subjective:     Stanley [name] is a 38 y.o. male and is here for a comprehensive physical exam. The patient reports Anemia, OI.  History   Social History  . Marital Status: Single    Spouse Name: N/A    Number of Children: N/A  . Years of Education: N/A   Occupational History  . Disabled     lives in group home, non ambulatory   Social History Main Topics  . Smoking status: Former Smoker    Quit date: 06/04/2004  . Smokeless tobacco: Never Used  . Alcohol Use: Yes     Comment: occasional  . Drug Use: No  . Sexual Activity: Not on file   Other Topics Concern  . Not on file   Social History Narrative  . No narrative on file   Health Maintenance  Topic Date Due  . Influenza Vaccine  02/02/2013  . Tetanus/tdap  06/14/2021    The following portions of the patient's history were reviewed and updated as appropriate: allergies, current medications, past family history, past medical history, past social history, past surgical history and problem list.  Review of Systems Pertinent items are noted in HPI.   Objective:    BP 126/90  Pulse 92  Temp(Src) 97.4 F (36.3 C) (Oral)  Wt 71 lb 8 oz (32.432 kg) General appearance: alert Head: atraumatic Eyes: negative findings: lids and lashes normal, conjunctivae and sclerae normal and pupils equal, round, reactive to light and accomodation Ears: B/L cerumen impaction Throat: lips, mucosa, and tongue normal; teeth and gums normal Neck: no adenopathy, no carotid bruit, no JVD, thyroid not enlarged, symmetric, no tenderness/mass/nodules and Neck neck deviated to the left. this is chronic. Lungs: clear to auscultation bilaterally Chest wall: no tenderness Heart: regular rate and rhythm, S1, S2 normal, no murmur, click, rub or gallop Abdomen: soft, non-tender; bowel sounds normal; no masses,  no organomegaly Extremities: short extremities, no pedal  swelling,dorsalis pedis palpable b/l Pulses: 2+ and symmetric Skin: Skin color, texture, turgor normal. No rashes or lesions Lymph nodes: Cervical, supraclavicular, and axillary nodes normal. Neurologic: Grossly normal    Assessment:    Healthy male exam. No acute finding      Plan:     See After Visit Summary for Counseling Recommendations

## 2013-08-15 DIAGNOSIS — Z0000 Encounter for general adult medical examination without abnormal findings: Secondary | ICD-10-CM | POA: Insufficient documentation

## 2013-08-15 NOTE — Assessment & Plan Note (Signed)
Likely GI source. I reviewed and discussed barium swallow test he had few months ago suggestive of upper GI mass and need to f/u with his gastroenterologist. Patient and his mom understands and agreed to schedule follow up with his gastroenterologist. CBC checked today. Continue ferrous sulphate.

## 2013-08-15 NOTE — Assessment & Plan Note (Addendum)
No acute finding. Bmet checked today. Flu shot given. Bus/transportation form completed.

## 2013-08-17 ENCOUNTER — Telehealth: Payer: Self-pay | Admitting: Family Medicine

## 2013-08-17 NOTE — Telephone Encounter (Signed)
Placed in MDs box. [name], Stanley Spotted

## 2013-08-17 NOTE — Telephone Encounter (Signed)
Pt is aware and would like to know the results of his labs. Ira Dougher,CMA

## 2013-08-17 NOTE — Telephone Encounter (Signed)
Caregiver dropped off forms to be filled out. Please fax to [phone].

## 2013-08-17 NOTE — Telephone Encounter (Signed)
Form completed and placed in front office for faxing.

## 2013-09-13 ENCOUNTER — Telehealth: Payer: Self-pay | Admitting: Family Medicine

## 2013-09-13 NOTE — Telephone Encounter (Signed)
Kathlee Nations faxed over documentation on [phone] requesting a developmental evualation on pt.  It was faxed back stating forms needed to be completed by psychiatrist Psychiatrist cant do the develomental evaluation  Please fax evaluation  To [phone]

## 2013-09-14 ENCOUNTER — Telehealth: Payer: Self-pay | Admitting: *Deleted

## 2013-09-14 NOTE — Telephone Encounter (Signed)
Pt called and states that [facility] sent an application for PCP to fill out.  He is not sure if they were faxed or mailed.  He needs them by the 09-19-13 since he is interviewing with them then.  Have you seen these forms?  Pt would like to speak with you regarding this if possible.  Can be reached at [phone].  [name],CMA

## 2013-09-15 ENCOUNTER — Other Ambulatory Visit: Payer: Self-pay | Admitting: Family Medicine

## 2013-09-15 NOTE — Telephone Encounter (Signed)
I have not seen the form, I will check on Tuesday when I return back to work.

## 2013-09-18 DIAGNOSIS — F845 Asperger's syndrome: Secondary | ICD-10-CM | POA: Insufficient documentation

## 2013-09-18 NOTE — Telephone Encounter (Signed)
Psychologic testing form completed and placed up front for faxing.

## 2013-09-18 NOTE — Telephone Encounter (Signed)
The last time i checked,no,but I will check again and let you know.

## 2013-09-18 NOTE — Telephone Encounter (Signed)
I still had this message in my box.  Did the form ever get to you?  [name],CMA

## 2013-09-18 NOTE — Telephone Encounter (Signed)
LM for Stanley [name] asking her to refax forms for PCP to fill out. Martha Soltys,CMA

## 2013-09-18 NOTE — Telephone Encounter (Signed)
Tried to call patient but he was unable to hear me due to being on the bus.  Will call back in a few minutes when he gets home.  Can you please ask him to clarify the application he needs filled out.  We never received scat form to be filled out by tomorrow.  The only form Dr. Gwendlyn Deutscher has is for a psych evaluation.  Please find out if there is a number or person we can call to get this scat form.  Thanks [employer]

## 2013-09-18 NOTE — Telephone Encounter (Signed)
Can they send in another copy of the document please?

## 2013-09-24 ENCOUNTER — Other Ambulatory Visit: Payer: Self-pay | Admitting: Family Medicine

## 2013-10-21 ENCOUNTER — Other Ambulatory Visit: Payer: Self-pay | Admitting: Family Medicine

## 2013-11-02 ENCOUNTER — Other Ambulatory Visit (HOSPITAL_COMMUNITY): Payer: Self-pay | Admitting: Internal Medicine

## 2013-11-02 DIAGNOSIS — D500 Iron deficiency anemia secondary to blood loss (chronic): Secondary | ICD-10-CM

## 2013-11-07 ENCOUNTER — Ambulatory Visit (HOSPITAL_COMMUNITY)
Admission: RE | Admit: 2013-11-07 | Discharge: 2013-11-07 | Disposition: A | Discharge status: Home / Self Care | Payer: Medicare Other | Source: Ambulatory Visit | Attending: Internal Medicine | Admitting: Internal Medicine

## 2013-11-07 DIAGNOSIS — K449 Diaphragmatic hernia without obstruction or gangrene: Secondary | ICD-10-CM | POA: Insufficient documentation

## 2013-11-07 DIAGNOSIS — D500 Iron deficiency anemia secondary to blood loss (chronic): Secondary | ICD-10-CM

## 2013-11-07 DIAGNOSIS — D509 Iron deficiency anemia, unspecified: Secondary | ICD-10-CM | POA: Insufficient documentation

## 2013-12-06 ENCOUNTER — Other Ambulatory Visit: Payer: Self-pay | Admitting: Family Medicine

## 2014-02-22 ENCOUNTER — Other Ambulatory Visit: Payer: Self-pay | Admitting: Family Medicine

## 2014-03-11 ENCOUNTER — Other Ambulatory Visit: Payer: Self-pay | Admitting: Family Medicine

## 2014-03-16 ENCOUNTER — Other Ambulatory Visit: Payer: Self-pay | Admitting: Family Medicine

## 2014-04-22 ENCOUNTER — Ambulatory Visit (INDEPENDENT_AMBULATORY_CARE_PROVIDER_SITE_OTHER): Payer: Medicare Other | Admitting: *Deleted

## 2014-04-22 DIAGNOSIS — Z23 Encounter for immunization: Secondary | ICD-10-CM

## 2014-05-12 ENCOUNTER — Other Ambulatory Visit: Payer: Self-pay | Admitting: Family Medicine

## 2014-05-20 ENCOUNTER — Other Ambulatory Visit: Payer: Self-pay | Admitting: Family Medicine

## 2014-06-04 ENCOUNTER — Ambulatory Visit (INDEPENDENT_AMBULATORY_CARE_PROVIDER_SITE_OTHER): Payer: Medicare Other | Admitting: Family Medicine

## 2014-06-04 VITALS — Ht <= 58 in | Wt <= 1120 oz

## 2014-06-04 DIAGNOSIS — E669 Obesity, unspecified: Secondary | ICD-10-CM

## 2014-06-04 NOTE — Progress Notes (Signed)
Medical Nutrition Therapy:  Appt start time: 1000 end time:  1100.  Assessment:  Primary concerns today: Weight management.   Stanley [name] has struggled to keep his weight under control, although he recognizes that his breathing, his BP, and overall function is much better when his weight is ~60 lb.  He has a new care giver, who is just learning the ropes of food provision for [employer], as well as some of Stanley [name]'s manipulating to sometimes get foods or amts he knows are not in his best interest.    Usual eating pattern includes 3 meals and 1 snacks per day. Frequent foods and beverages include brown rice, peas, carrots, granola w/ Grk yogurt, Cheerios, sandwich, flavored water, 2 c coffee w/ sk milk, 2 pks Splenda/day.  Avoided foods include squash.   Usual physical activity includes 2 min 7 X wk bands exercise.  24-hr recall: (Up at 7 AM) B (7 AM)-   1 egg, 1 slc toast, 1 c coffee, sk milk, Splenda Snk ( AM)-   --- L (11:30 PM)-  Ham sandw, 1 tsp lite mayo, flvr'd water Snk ( PM)-  water D (6:30 PM)-  1/2 c cole slaw, 1 c brn rice, tom's, & ground [place], decaf coffee, sk milk, Splenda Snk ( PM)-  --- Typical day? Yes.    Progress Towards Goal(s):  In progress.   Nutritional Diagnosis:  [place]-3.3 Overweight/obesity As related to energy intake.  As evidenced by BMI >30.    Intervention:  Nutrition education.  Handouts given during visit include:  AVS  Dietary Recommendations (updated form from Aug 2014), signed by Dr. Wendy Poet.  Demonstrated degree of understanding via:  Teach Back   Monitoring/Evaluation:  Dietary intake, exercise, and body weight prn.

## 2014-06-04 NOTE — Patient Instructions (Addendum)
-   Please request a vitamin D level at your annual exam.   Nutrition Guidelines - Obtain twice as many veg's as protein or carbohydrate foods for both lunch and dinner. - Eat at least 3 REAL meals and 1-2 snacks per day.  Aim for no more than 5 hours between eating.  Eat breakfast within one hour of getting up.   - REAL meal = includes protein, starch, and veg's and/or fruit.  - Fresh or frozen veg's are best.    - Winter:  Soups are great ways to incorp veg's; Summer:  Salads!  (Consider adding dried fruit, a tsp of nuts/seeds, a tsp of cheese, water chestnuts.)

## 2014-06-06 IMAGING — RF DG UGI W/ KUB
14 of 19 series · 14 of 19 positions shown · non-contrast
Comparison: Upper GI 12/11/2012

CLINICAL DATA: Iron deficiency anemia.

EXAM:
UPPER GI SERIES WITH KUB
TECHNIQUE: After obtaining a scout radiograph a routine upper GI series was
performed using thin barium
FLUOROSCOPY TIME:  Three Min and 33 seconds

[Series 1: run · 1 of 1 slices shown (1 of 14)]
[im 1/1]
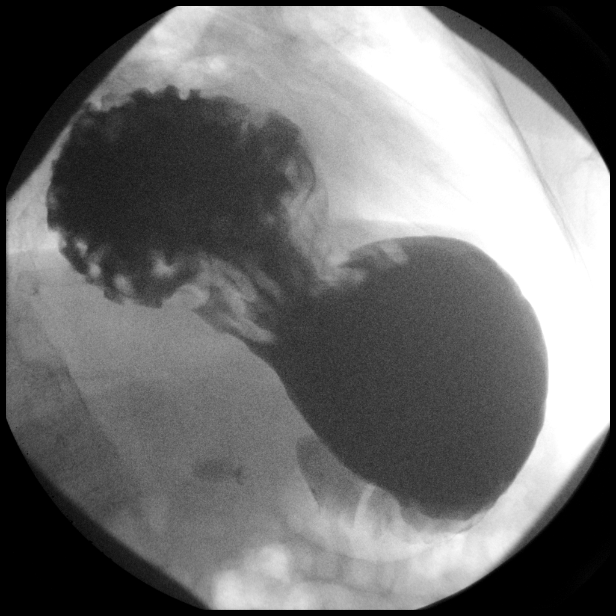

[Series 3: run · 1 of 1 slices shown (2 of 14)]
[im 1/1]
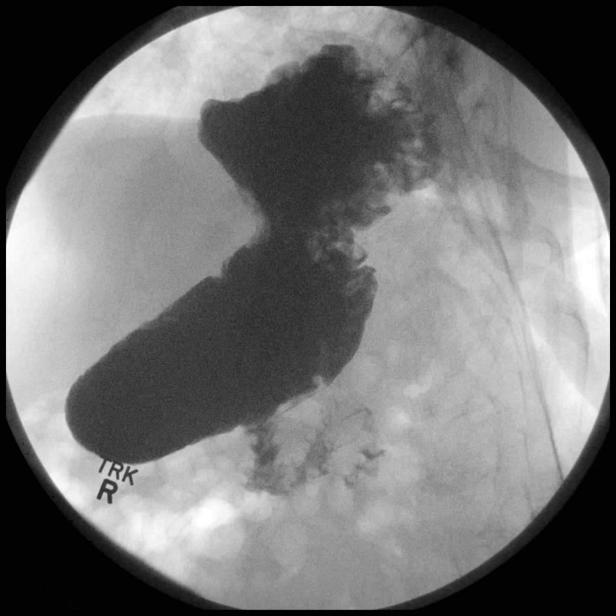

[Series 4: run · 1 of 1 slices shown (3 of 14)]
[im 1/1]
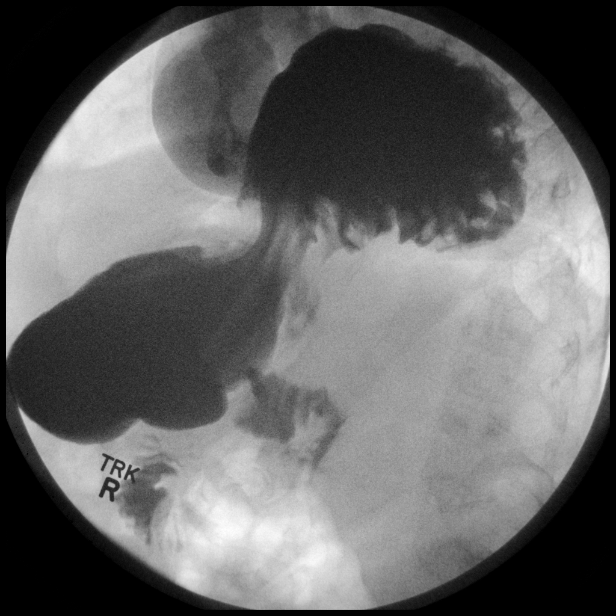

[Series 5: run · 1 of 1 slices shown (4 of 14)]
[im 1/1]
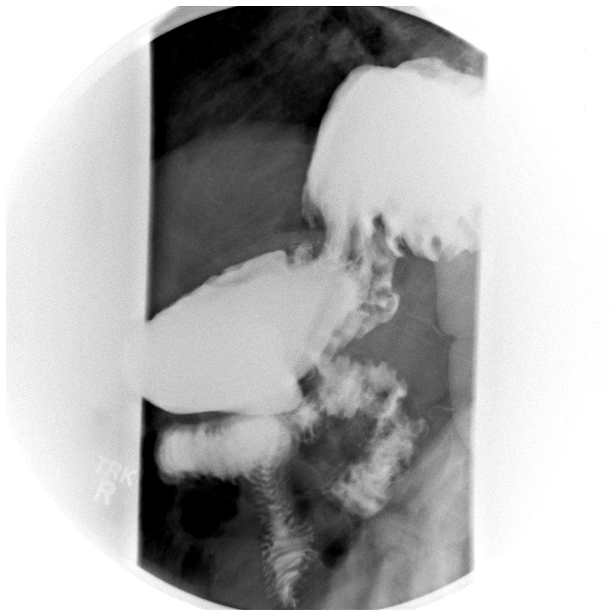

[Series 7: run · 1 of 1 slices shown (5 of 14)]
[im 1/1]
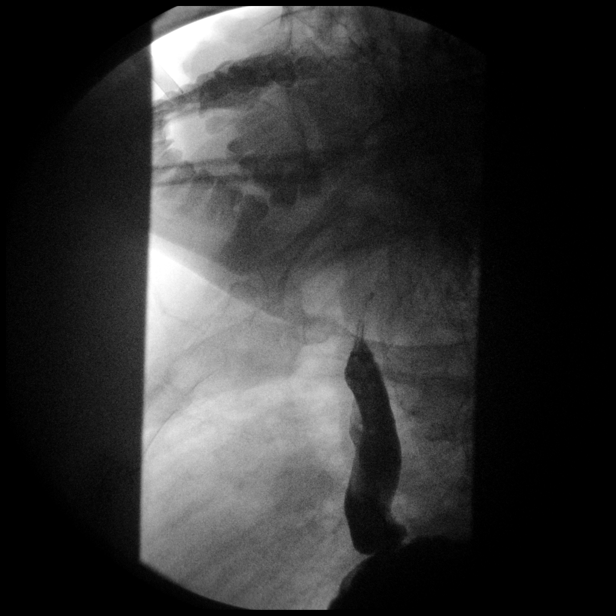

[Series 8: run · 1 of 1 slices shown (6 of 14)]
[im 1/1]
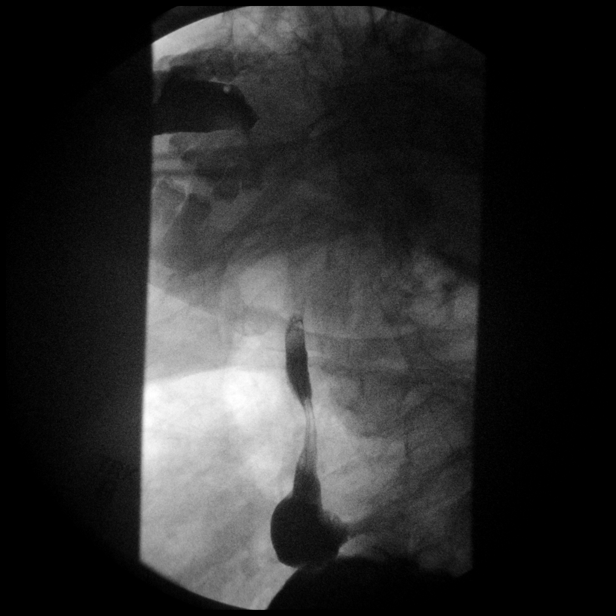

[Series 9: run · 1 of 1 slices shown (7 of 14)]
[im 1/1]
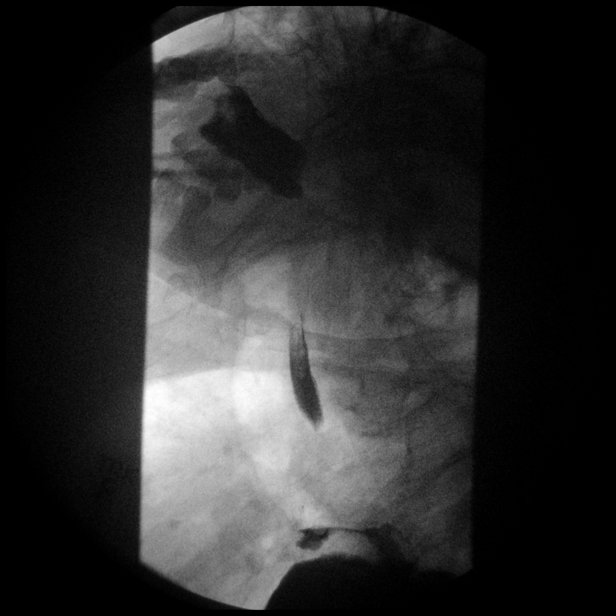

[Series 11: run · 1 of 1 slices shown (8 of 14)]
[im 1/1]
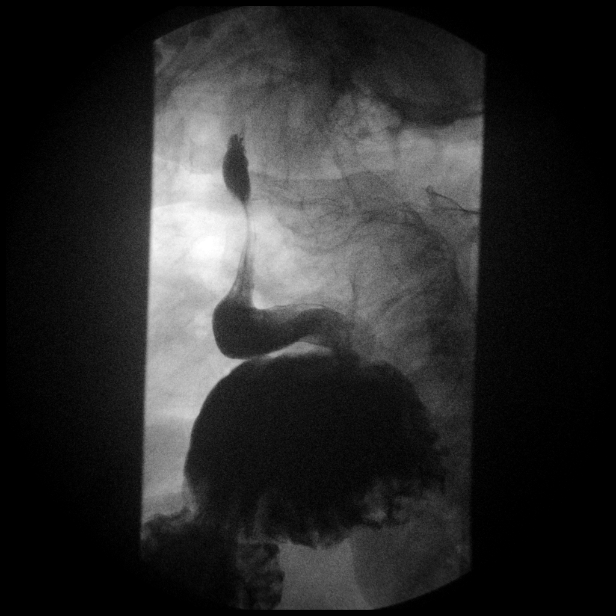

[Series 12: run · 1 of 1 slices shown (9 of 14)]
[im 1/1]
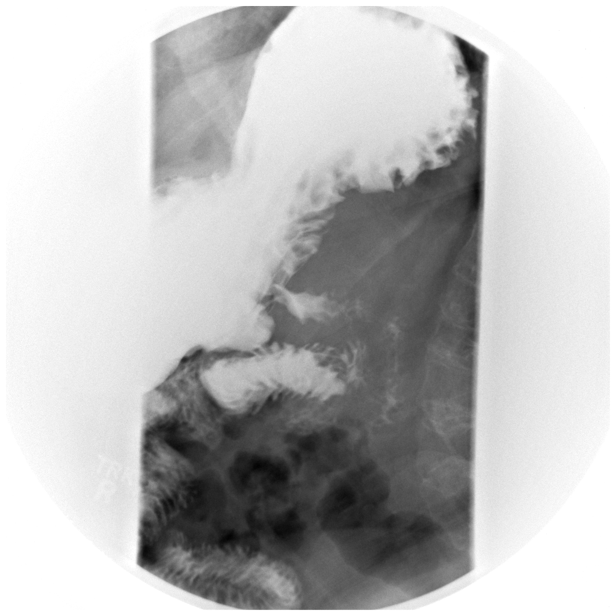

[Series 13: run · 1 of 1 slices shown (10 of 14)]
[im 1/1]
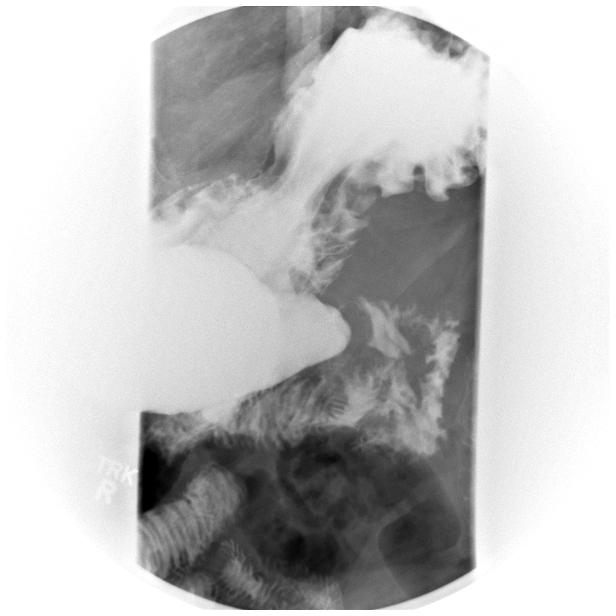

[Series 15: run · 1 of 1 slices shown (11 of 14)]
[im 1/1]
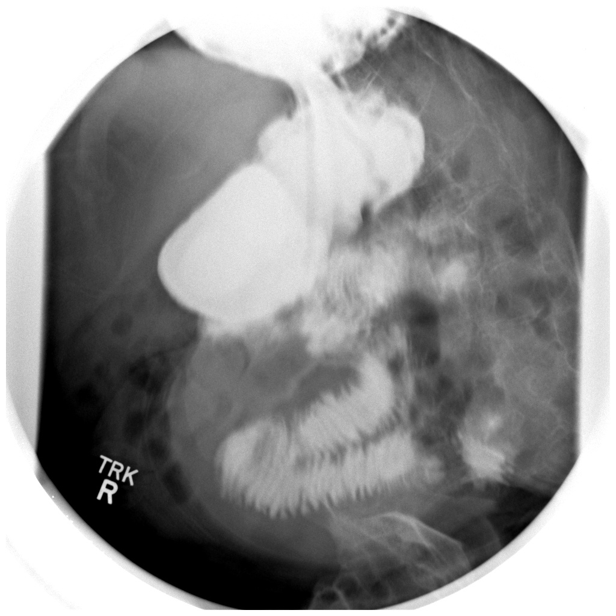

[Series 16: run · 1 of 1 slices shown (12 of 14)]
[im 1/1]
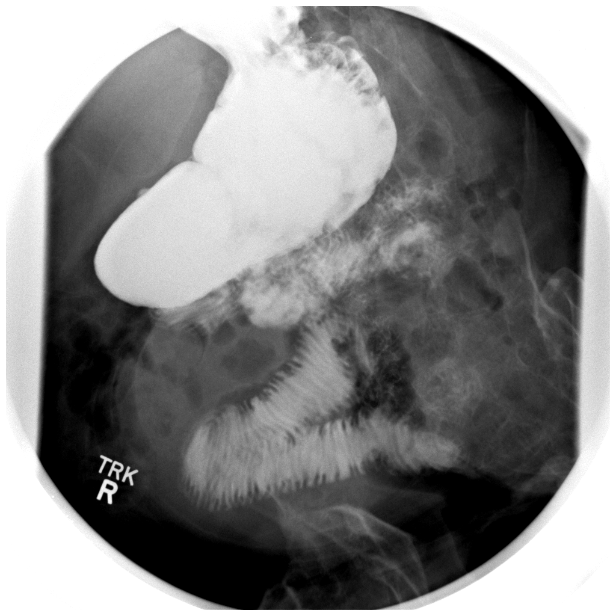

[Series 17: run · 1 of 1 slices shown (13 of 14)]
[im 1/1]
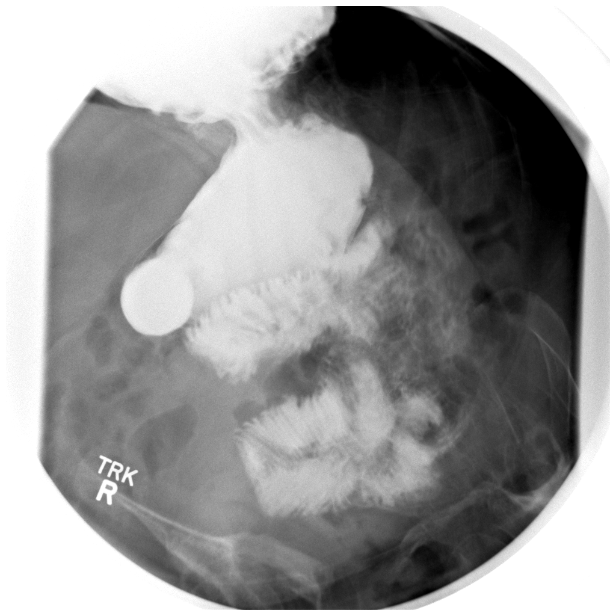

[Series 19: run · 1 of 1 slices shown (14 of 14)]
[im 1/1]
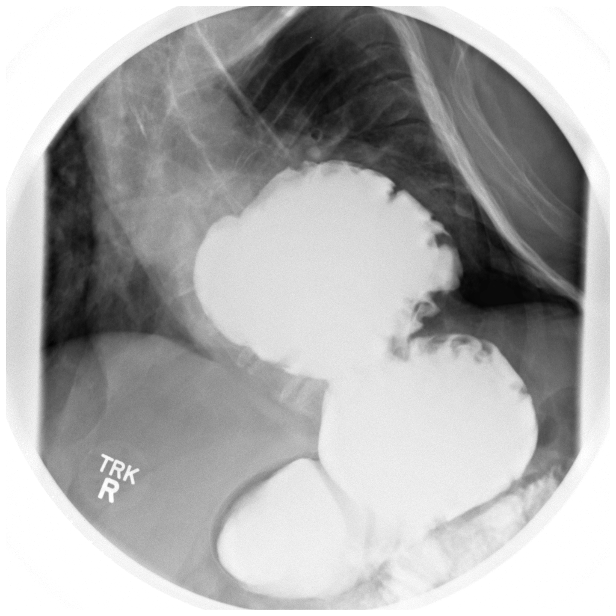

[14 of 19 positions shown; findings below may reference images not displayed]

FINDINGS: KUB reveals normal bowel gas pattern. Marked skeletal deformities
are noted.

Large hiatal hernia is present, similar to the prior study.
Esophageal mucosa and motility are normal. No esophageal stricture.
Barium tablet not administered.

Approximate [DATE] of the stomach is above the diaphragm. Prior study
questioned edema involving the greater curve of the stomach. This is
significantly less prominent today and there does not appear to be a
mass or gastritis present on today's study. The stomach empties into
the duodenum. Duodenal bulb is normal.

The jejunum is mildly dilated with closely spaced valvulae
conniventes. This radiographic appearance has been described a stack
of coins and can be seen with scleroderma and sprue.
IMPRESSION: Large hiatal hernia.  No evidence of gastritis or mass.

Stack of coins appearance in the jejunum. Differential diagnosis
includes scleroderma and sprue.

## 2014-06-18 ENCOUNTER — Ambulatory Visit (INDEPENDENT_AMBULATORY_CARE_PROVIDER_SITE_OTHER): Payer: Medicare Other | Admitting: Family Medicine

## 2014-06-18 ENCOUNTER — Encounter: Payer: Self-pay | Admitting: Family Medicine

## 2014-06-18 VITALS — BP 108/75 | HR 57 | Temp 97.8°F | Wt <= 1120 oz

## 2014-06-18 DIAGNOSIS — D509 Iron deficiency anemia, unspecified: Secondary | ICD-10-CM

## 2014-06-18 DIAGNOSIS — K279 Peptic ulcer, site unspecified, unspecified as acute or chronic, without hemorrhage or perforation: Secondary | ICD-10-CM

## 2014-06-18 DIAGNOSIS — I10 Essential (primary) hypertension: Secondary | ICD-10-CM

## 2014-06-18 DIAGNOSIS — F330 Major depressive disorder, recurrent, mild: Secondary | ICD-10-CM

## 2014-06-18 LAB — COMPREHENSIVE METABOLIC PANEL
ALK PHOS: 78 U/L (ref 39–117)
ALT: 17 U/L (ref 0–53)
AST: 20 U/L (ref 0–37)
Albumin: 4 g/dL (ref 3.5–5.2)
BILIRUBIN TOTAL: 0.5 mg/dL (ref 0.2–1.2)
BUN: 10 mg/dL (ref 6–23)
CO2: 28 mEq/L (ref 19–32)
Calcium: 9.2 mg/dL (ref 8.4–10.5)
Chloride: 101 mEq/L (ref 96–112)
Creat: 0.32 mg/dL — ABNORMAL LOW (ref 0.50–1.35)
Glucose, Bld: 70 mg/dL (ref 70–99)
POTASSIUM: 4.4 meq/L (ref 3.5–5.3)
Sodium: 139 mEq/L (ref 135–145)
TOTAL PROTEIN: 7.1 g/dL (ref 6.0–8.3)

## 2014-06-18 LAB — CBC WITH DIFFERENTIAL/PLATELET
BASOS ABS: 0.1 10*3/uL (ref 0.0–0.1)
BASOS PCT: 1 % (ref 0–1)
EOS PCT: 8 % — AB (ref 0–5)
Eosinophils Absolute: 0.5 10*3/uL (ref 0.0–0.7)
HCT: 48.2 % (ref 39.0–52.0)
Hemoglobin: 15.7 g/dL (ref 13.0–17.0)
Lymphocytes Relative: 34 % (ref 12–46)
Lymphs Abs: 2.1 10*3/uL (ref 0.7–4.0)
MCH: 30.7 pg (ref 26.0–34.0)
MCHC: 32.6 g/dL (ref 30.0–36.0)
MCV: 94.1 fL (ref 78.0–100.0)
MPV: 11.3 fL (ref 9.4–12.4)
Monocytes Absolute: 0.6 10*3/uL (ref 0.1–1.0)
Monocytes Relative: 9 % (ref 3–12)
Neutro Abs: 3 10*3/uL (ref 1.7–7.7)
Neutrophils Relative %: 48 % (ref 43–77)
PLATELETS: 192 10*3/uL (ref 150–400)
RBC: 5.12 MIL/uL (ref 4.22–5.81)
RDW: 13 % (ref 11.5–15.5)
WBC: 6.3 10*3/uL (ref 4.0–10.5)

## 2014-06-18 LAB — LIPID PANEL
CHOL/HDL RATIO: 4.4 ratio
Cholesterol: 167 mg/dL (ref 0–200)
HDL: 38 mg/dL — AB (ref >39)
LDL CALC: 108 mg/dL — AB (ref 0–99)
Triglycerides: 104 mg/dL (ref <150)
VLDL: 21 mg/dL (ref 0–40)

## 2014-06-18 NOTE — Progress Notes (Signed)
Subjective:     Patient ID: Stanley [name], male   DOB: July 18, 1975, 38 y.o.   MRN: [id number]  HPI  HTN: Currently on Norvasc 5 mg daily and Metoprolol 14m BID, he has been compliant with his medication, denies any major concern, here for follow up. Anemia:Currently taking ferrous sulphate 1 tab daily instead of twice daily since his last Hgb was normal. He denies any blood in stools, he takes MVI and his diet has been good. PPNT:IRWERXany abdominal pain, no blood in stool, no N/V, compliant with his Prilosec, he was recently seen by GI who did barium swallow for suspicious mass in his gastrum, per patient test was normal. Depression: Compliant with his medications, being managed by his psychiatrist who sent him over today for blood work due to the medications he is on. He is doing well he said.  Current Outpatient Prescriptions on File Prior to Visit  Medication Sig Dispense Refill  . amLODipine (NORVASC) 5 MG tablet TAKE 1 TABLET BY MOUTH EVERY DAY (Patient taking differently: 1 daily.) 30 tablet 3  . ARIPiprazole (ABILIFY) 20 MG tablet Take 15 mg by mouth daily. Prescribed by [facility]    . cetirizine (ZYRTEC) 10 MG tablet TAKE 1 TABLET BY MOUTH EVERY DAY 90 tablet 2  . Fish Oil OIL by Does not apply route.    . methylphenidate (CONCERTA) 36 MG CR tablet Take 1 tablet (36 mg total) by mouth daily. Prescribed by [facility]    . metoprolol tartrate (LOPRESSOR) 25 MG tablet TAKE 1 TABLET BY MOUTH TWICE A DAY FOR BLOOD PRESSURE 60 tablet 4  . Multiple Vitamin (MULTIVITAMIN) tablet Take 1 tablet by mouth daily.      .Marland Kitchenomeprazole (PRILOSEC) 20 MG capsule TAKE ONE CAPSULE BY MOUTH TWICE A DAY 60 capsule 4  . ferrous sulfate 325 (65 FE) MG tablet TAKE 1 TABLET (325 MG TOTAL) BY MOUTH 2 (TWO) TIMES DAILY. (Patient taking differently: TAKE 1 TABLET (325 MG TOTAL) BY MOUTH.) 60 tablet 4   No current facility-administered medications on file prior to visit.   Past  Medical History  Diagnosis Date  . Asperger's disorder   . Osteogenesis imperfecta   . Peptic ulcer   . Depression   . Allergic rhinitis   . Hypertension   . GERD (gastroesophageal reflux disease)   . Psychosis   . Seborrheic dermatitis   . Abnormal PFTs   . Iron deficiency anemia   . Hiatal hernia       Review of Systems  Respiratory: Negative.   Cardiovascular: Negative.   Gastrointestinal: Negative.   Genitourinary: Negative.   All other systems reviewed and are negative.  Filed Vitals:   06/18/14 0834  BP: 108/75  Pulse: 57  Temp: 97.8 F (36.6 C)  TempSrc: Oral  Weight: 61 lb 12 oz (28.01 kg)       Objective:   Physical Exam  Constitutional: He appears well-developed. No distress.  Cardiovascular: Normal rate, regular rhythm, normal heart sounds and intact distal pulses.   No murmur heard. Pulmonary/Chest: Effort normal and breath sounds normal. No respiratory distress. He has no wheezes.  Abdominal: Soft. Bowel sounds are normal. He exhibits no distension and no mass. There is no tenderness.  Psychiatric: He has a normal mood and affect. His behavior is normal. Judgment and thought content normal. He expresses no homicidal and no suicidal ideation.  Nursing note and vitals reviewed.      Assessment:  HTN: Anemia: PUD: Depression     Plan:     Check problem list.

## 2014-06-18 NOTE — Assessment & Plan Note (Addendum)
Compliant with iron tablet. CBC checked today. I will call him with result and adjust his medication as needed.

## 2014-06-18 NOTE — Patient Instructions (Signed)
It was nice seeing you today Stanley [name], I am glad you are doing well in general. We will get some blood test done today, and I will call you with your test result as soon as we have it. I will see you back in 6-12 months, but please call if you have any concern.

## 2014-06-18 NOTE — Assessment & Plan Note (Signed)
No acute bleed. Seem stable on Protonix. Continue same regimen for now. GI follow up as instructed.

## 2014-06-18 NOTE — Assessment & Plan Note (Signed)
Managed by [facility]. Not suicidal. Cmet ordered as requested. Continue Abilify. F/U Psych as instructed.

## 2014-06-18 NOTE — Assessment & Plan Note (Signed)
BP looks ok today. Continue current regimen. FLP and Cmet checked today.

## 2014-06-19 ENCOUNTER — Encounter: Payer: Self-pay | Admitting: *Deleted

## 2014-08-18 ENCOUNTER — Other Ambulatory Visit: Payer: Self-pay | Admitting: Family Medicine

## 2014-08-23 ENCOUNTER — Ambulatory Visit: Payer: Medicare Other | Admitting: Family Medicine

## 2014-08-27 ENCOUNTER — Ambulatory Visit (INDEPENDENT_AMBULATORY_CARE_PROVIDER_SITE_OTHER): Payer: Medicare Other | Admitting: Family Medicine

## 2014-08-27 ENCOUNTER — Encounter: Payer: Self-pay | Admitting: Family Medicine

## 2014-08-27 VITALS — BP 131/93 | HR 68 | Temp 97.3°F | Wt <= 1120 oz

## 2014-08-27 DIAGNOSIS — K279 Peptic ulcer, site unspecified, unspecified as acute or chronic, without hemorrhage or perforation: Secondary | ICD-10-CM

## 2014-08-27 DIAGNOSIS — I10 Essential (primary) hypertension: Secondary | ICD-10-CM

## 2014-08-27 NOTE — Assessment & Plan Note (Signed)
As discussed with patient I will prefer he avoids spicy food rather than increasing dose of his Prilosec. I discussed s/e of Prilosec which include bone fracture. For now we will continue current dose of Prilosec and avoid spicy food. He agreed with plan.

## 2014-08-27 NOTE — Patient Instructions (Signed)
Hypertension Hypertension is another name for high blood pressure. High blood pressure forces your heart to work harder to pump blood. A blood pressure reading has two numbers, which includes a higher number over a lower number (example: 110/72). HOME CARE   Have your blood pressure rechecked by your doctor.  Only take medicine as told by your doctor. Follow the directions carefully. The medicine does not work as well if you skip doses. Skipping doses also puts you at risk for problems.  Do not smoke.  Monitor your blood pressure at home as told by your doctor. GET HELP IF:  You think you are having a reaction to the medicine you are taking.  You have repeat headaches or feel dizzy.  You have puffiness (swelling) in your ankles.  You have trouble with your vision. GET HELP RIGHT AWAY IF:   You get a very bad headache and are confused.  You feel weak, numb, or faint.  You get chest or belly (abdominal) pain.  You throw up (vomit).  You cannot breathe very well. MAKE SURE YOU:   Understand these instructions.  Will watch your condition.  Will get help right away if you are not doing well or get worse. Document Released: 12/08/2007 Document Revised: 06/26/2013 Document Reviewed: 04/13/2013 [facility] Patient Information 2015 [place], [place]. This information is not intended to replace advice given to you by your health care provider. Make sure you discuss any questions you have with your health care provider.

## 2014-08-27 NOTE — Progress Notes (Signed)
Subjective:     Patient ID: Stanley [name], male   DOB: September 22, 1975, 39 y.o.   MRN: [id number]  HPI  BZM:[id number] with metoprolol, he is here for follow up. QAE:SLPNPYY will like to increase the dose of his Prilosec, he eat spicy food few days ago which caused him severe heart burn, he has not had another episode since then, no other GI complaints.  Current Outpatient Prescriptions on File Prior to Visit  Medication Sig Dispense Refill  . amLODipine (NORVASC) 5 MG tablet TAKE 1 TABLET BY MOUTH EVERY DAY (Patient taking differently: 1 daily.) 30 tablet 3  . ARIPiprazole (ABILIFY) 20 MG tablet Take 15 mg by mouth daily. Prescribed by [facility]    . cetirizine (ZYRTEC) 10 MG tablet TAKE 1 TABLET BY MOUTH EVERY DAY 90 tablet 2  . ferrous sulfate 325 (65 FE) MG tablet TAKE 1 TABLET (325 MG TOTAL) BY MOUTH 2 (TWO) TIMES DAILY. (Patient taking differently: TAKE 1 TABLET (325 MG TOTAL) BY MOUTH.) 60 tablet 4  . Fish Oil OIL by Does not apply route.    . methylphenidate (CONCERTA) 36 MG CR tablet Take 1 tablet (36 mg total) by mouth daily. Prescribed by [facility]    . metoprolol tartrate (LOPRESSOR) 25 MG tablet TAKE 1 TABLET BY MOUTH TWICE A DAY FOR BLOOD PRESSURE 60 tablet 4  . Multiple Vitamin (MULTIVITAMIN) tablet Take 1 tablet by mouth daily.      Marland Kitchen omeprazole (PRILOSEC) 20 MG capsule TAKE ONE CAPSULE BY MOUTH TWICE A DAY 60 capsule 4   No current facility-administered medications on file prior to visit.   Past Medical History  Diagnosis Date  . Asperger's disorder   . Osteogenesis imperfecta   . Peptic ulcer   . Depression   . Allergic rhinitis   . Hypertension   . GERD (gastroesophageal reflux disease)   . Psychosis   . Seborrheic dermatitis   . Abnormal PFTs   . Iron deficiency anemia   . Hiatal hernia     Review of Systems  Respiratory: Negative.   Cardiovascular: Negative.   Gastrointestinal:       Heart burn.  Genitourinary: Negative.    Musculoskeletal: Negative.   All other systems reviewed and are negative.      Filed Vitals:   08/27/14 1044  BP: 131/93  Pulse: 68  Temp: 97.3 F (36.3 C)  Weight: 61 lb (27.669 kg)    Objective:   Physical Exam  Constitutional: He appears well-developed. No distress.  Cardiovascular: Normal rate, regular rhythm, normal heart sounds and intact distal pulses.   No murmur heard. Pulmonary/Chest: Effort normal and breath sounds normal. No respiratory distress. He has no wheezes.  Abdominal: Soft. Bowel sounds are normal. He exhibits no distension and no mass. There is no tenderness.  Musculoskeletal: He exhibits no edema.  Nursing note and vitals reviewed.      Assessment:     HTN: PUD:    Plan:     Check problem list.

## 2014-08-27 NOTE — Assessment & Plan Note (Signed)
Systolic BP elevated slightly today. BP was taken from his LL. For now no adjustment was made to his antihypertensive. Reduce salt in diet. Reassess during next visit.

## 2014-09-26 ENCOUNTER — Other Ambulatory Visit: Payer: Self-pay | Admitting: Family Medicine

## 2014-10-26 ENCOUNTER — Other Ambulatory Visit: Payer: Self-pay | Admitting: Family Medicine

## 2014-11-18 ENCOUNTER — Telehealth: Payer: Self-pay | Admitting: *Deleted

## 2014-11-18 NOTE — Telephone Encounter (Signed)
LM for patient to call and inform of [place] what day he can come by and sign form.  Will let provider know so we can have form ready for patient to sign. Jamyson Jirak,CMA

## 2014-11-18 NOTE — Telephone Encounter (Signed)
Thanks

## 2014-11-18 NOTE — Telephone Encounter (Signed)
-----   Message from Kinnie Feil, MD sent at 11/18/2014  7:43 AM EDT ----- Please call patient and inform him that I got a medication document from [place] [employer]. I need his signature on the document before I can sign it. Have him come by the clinic to sign document which I left in the front office for him.

## 2014-12-31 ENCOUNTER — Other Ambulatory Visit: Payer: Self-pay | Admitting: Family Medicine

## 2015-02-04 ENCOUNTER — Other Ambulatory Visit: Payer: Self-pay | Admitting: Family Medicine

## 2015-03-04 ENCOUNTER — Other Ambulatory Visit: Payer: Self-pay | Admitting: Family Medicine

## 2015-03-11 ENCOUNTER — Ambulatory Visit (INDEPENDENT_AMBULATORY_CARE_PROVIDER_SITE_OTHER): Payer: Medicare Other | Admitting: Family Medicine

## 2015-03-11 ENCOUNTER — Encounter: Payer: Self-pay | Admitting: Family Medicine

## 2015-03-11 VITALS — BP 108/38 | HR 62 | Temp 98.2°F | Ht <= 58 in | Wt <= 1120 oz

## 2015-03-11 DIAGNOSIS — Z0000 Encounter for general adult medical examination without abnormal findings: Secondary | ICD-10-CM

## 2015-03-11 DIAGNOSIS — Z23 Encounter for immunization: Secondary | ICD-10-CM

## 2015-03-11 DIAGNOSIS — D649 Anemia, unspecified: Secondary | ICD-10-CM

## 2015-03-11 LAB — POCT HEMOGLOBIN: Hemoglobin: 14.9 g/dL (ref 14.1–18.1)

## 2015-03-11 MED ORDER — KETOCONAZOLE 2 % EX SHAM: 1.0000 "application " | MEDICATED_SHAMPOO | CUTANEOUS | Status: DC

## 2015-03-11 NOTE — Patient Instructions (Signed)
It was nice seeing you today. You do have some ear wax which we will wash out today, otherwise your exam is normal. Follow up with me as needed.

## 2015-03-11 NOTE — Progress Notes (Signed)
Patient ID: Stanley [name], male   DOB: 1976/06/08, 39 y.o.   MRN: [id number] Subjective:     Stanley [name] is a 39 y.o. male and is here for a comprehensive physical exam. The patient reports problems - red spot in stool, dryness in scalp. Denies nausea or vomiting,no belly ache. No diarrhea or constipation. Patient noticed red spot one time few days ago. Insurance not covering his Zyrtec.  Social History   Social History  . Marital Status: Single    Spouse Name: N/A  . Number of Children: N/A  . Years of Education: N/A   Occupational History  . Disabled     lives in group home, non ambulatory   Social History Main Topics  . Smoking status: Former Smoker    Quit date: 06/04/2004  . Smokeless tobacco: Never Used  . Alcohol Use: Yes     Comment: occasional  . Drug Use: No  . Sexual Activity: Not on file   Other Topics Concern  . Not on file   Social History Narrative   Health Maintenance  Topic Date Due  . HIV Screening  06/06/1991  . INFLUENZA VACCINE  02/03/2015  . TETANUS/TDAP  06/14/2021    The following portions of the patient's history were reviewed and updated as appropriate: allergies, current medications, past family history, past medical history, past social history, past surgical history and problem list.  Review of Systems Pertinent items are noted in HPI.   Objective:    BP 108/38 mmHg  Pulse 62  Temp(Src) 98.2 F (36.8 C) (Oral)  Ht 3' 1"  (0.94 m)  Wt 60 lb (27.216 kg)  BMI 30.80 kg/m2 General appearance: alert and cooperative Head: atraumatic, dry scaly scalp. Eyes: conjunctivae/corneas clear. PERRL, EOM's intact. Fundi benign. Ears: left ear cerumen impaction Throat: lips, mucosa, and tongue normal; teeth and gums normal Neck: no adenopathy, no carotid bruit, no JVD, supple, symmetrical, trachea midline and thyroid not enlarged, symmetric, no tenderness/mass/nodules Lungs: clear to auscultation bilaterally Heart: regular rate and  rhythm, S1, S2 normal, no murmur, click, rub or gallop Abdomen: soft, non-tender; bowel sounds normal; no masses,  no organomegaly Extremities: extremities normal, atraumatic, no cyanosis or edema Pulses: 2+ and symmetric Skin: Skin color, texture, turgor normal. No rashes or lesions Neurologic: Grossly normal    Assessment:    Healthy male exam. Cerumen impaction    ?? Blood in stool Seborrheic dermatitis of the scalp.     Plan:     Normal exam other than the cerumen impaction.   Flu shot given   Ketoconazole shampoo for his scalp.   Hgb POC =14.9, Stool card given to assess GI bleed, he will return with his stool.    I will check to see if there is another allergy medicine his insurance will cover, otherwise he can get zyrtec OTC.    Left ear cerumen disimpacted today with water lavage. See After Visit Summary for Counseling Recommendations

## 2015-04-13 ENCOUNTER — Other Ambulatory Visit: Payer: Self-pay | Admitting: Family Medicine

## 2015-05-21 ENCOUNTER — Telehealth: Payer: Self-pay | Admitting: Family Medicine

## 2015-05-21 NOTE — Telephone Encounter (Signed)
Stanley [name] is calling from [employer] to check on the status of paperwork that was faxed to provider on 05/07/2015 for an rx for the patient's power wheel chair. Please advise. Thank you, Fonda Kinder, ASA

## 2015-05-21 NOTE — Telephone Encounter (Signed)
Will forward to MD to check status of form. Mckinzi Eriksen,CMA

## 2015-05-21 NOTE — Telephone Encounter (Signed)
I can't remember getting any paper work for this patient. Can they refax it please?

## 2015-05-21 NOTE — Telephone Encounter (Signed)
Spoke with heather at [employer] and they will refax form to provider's attention. Will forward note back to MD so we can document that it was received and completed. [name],CMA

## 2015-05-26 NOTE — Telephone Encounter (Signed)
Since I still don't have form from [place], I will close this encounter.

## 2015-07-03 ENCOUNTER — Telehealth: Payer: Self-pay | Admitting: Family Medicine

## 2015-07-03 NOTE — Telephone Encounter (Signed)
I did not get any order. Please advise him to call [employer] to re-fax the form.

## 2015-07-03 NOTE — Telephone Encounter (Signed)
Spoke with [employer] and they state that the fax was sent back with a note that patient didn't belong to this clinic.  I verified fax number and asked her to refax order.  I haven't seen order yet but will keep checking the fax machine. [name],CMA

## 2015-07-03 NOTE — Telephone Encounter (Signed)
I did not get any order

## 2015-07-03 NOTE — Telephone Encounter (Signed)
Will forward to MD to see if she has seen these orders and if not i can call [employer] to get them resent. Quince Santana,CMA

## 2015-07-03 NOTE — Telephone Encounter (Signed)
Pt calling and states that PCP should have gotten orders for wheelchair parts because his chair needs to be fixed. He states that the orders were sent a few weeks ago by [employer] and they haven't been signed and returned yet. [employer]'s phone number is [phone]. Thank you, Fonda Kinder, ASA

## 2015-07-03 NOTE — Telephone Encounter (Signed)
Will check with MD to see if this order was placed in her box. Stanley [name],CMA

## 2015-07-04 NOTE — Telephone Encounter (Signed)
Spoke with Gerald Stabs with [employer] and made her aware that we never received the fax from yesterday.  She plans to refax this morning to my attention. Shanaiya Bene,CMA

## 2015-07-04 NOTE — Telephone Encounter (Signed)
Form received from [employer] and given to pcp to sign off on repairs. Stanley [name],CMA

## 2015-07-04 NOTE — Telephone Encounter (Signed)
Form has been completed and signed by me. I placed it up front for faxing. Thank you for facilitating this Jazmin.

## 2015-07-08 NOTE — Telephone Encounter (Signed)
Form faxed on Friday 07/04/2015. Daveion Robar,CMA

## 2015-07-11 NOTE — Telephone Encounter (Signed)
Brooke from [employer] and states that fax came through but looks like it was folded through the machine and the PCP's signature cannot be read. Please re-fax. Sadie Reynolds, ASA

## 2015-07-11 NOTE — Telephone Encounter (Signed)
Form refaxed to [employer]. [name],CMA

## 2015-07-16 ENCOUNTER — Other Ambulatory Visit: Payer: Self-pay | Admitting: Family Medicine

## 2015-08-13 ENCOUNTER — Other Ambulatory Visit: Payer: Self-pay | Admitting: Family Medicine

## 2015-09-21 ENCOUNTER — Other Ambulatory Visit: Payer: Self-pay | Admitting: Family Medicine

## 2015-11-18 ENCOUNTER — Other Ambulatory Visit: Payer: Self-pay | Admitting: Family Medicine

## 2015-11-19 ENCOUNTER — Other Ambulatory Visit: Payer: Self-pay | Admitting: Family Medicine

## 2015-12-31 ENCOUNTER — Other Ambulatory Visit: Payer: Medicare Other

## 2016-01-01 ENCOUNTER — Encounter: Payer: Self-pay | Admitting: Family Medicine

## 2016-01-01 ENCOUNTER — Other Ambulatory Visit: Payer: Self-pay | Admitting: Family Medicine

## 2016-01-01 DIAGNOSIS — Q780 Osteogenesis imperfecta: Secondary | ICD-10-CM

## 2016-01-01 DIAGNOSIS — R269 Unspecified abnormalities of gait and mobility: Secondary | ICD-10-CM

## 2016-01-01 NOTE — Progress Notes (Signed)
Patient ID: Stanley [name], male   DOB: 1976-05-13, 40 y.o.   MRN: [id number] I got a letter from [employer] that patient is needing a new cushion and repair of his power wheel chair. In other to complete this he is needing physical therapy assessment. Hence, they are requesting PT order.  Order placed on Epic, printed and faxed to [employer].

## 2016-01-01 NOTE — Progress Notes (Signed)
Patient ID: Stanley [name], male   DOB: 09-01-1975, 40 y.o.   MRN: [id number] I got a letter from [employer] that patient is needing a new cushion and repair of his power wheel chair. In other to complete this he is needing physical therapy assessment. Hence, they are requesting PT order.  Order placed on Epic, printed and faxed to [employer].

## 2016-01-11 ENCOUNTER — Other Ambulatory Visit: Payer: Self-pay | Admitting: Family Medicine

## 2016-01-13 ENCOUNTER — Ambulatory Visit (INDEPENDENT_AMBULATORY_CARE_PROVIDER_SITE_OTHER): Payer: Medicare Other | Admitting: Family Medicine

## 2016-01-13 ENCOUNTER — Encounter: Payer: Self-pay | Admitting: Family Medicine

## 2016-01-13 VITALS — BP 143/80 | HR 77 | Temp 97.8°F

## 2016-01-13 DIAGNOSIS — Z114 Encounter for screening for human immunodeficiency virus [HIV]: Secondary | ICD-10-CM | POA: Diagnosis not present

## 2016-01-13 DIAGNOSIS — I10 Essential (primary) hypertension: Secondary | ICD-10-CM

## 2016-01-13 DIAGNOSIS — J309 Allergic rhinitis, unspecified: Secondary | ICD-10-CM | POA: Diagnosis not present

## 2016-01-13 DIAGNOSIS — L219 Seborrheic dermatitis, unspecified: Secondary | ICD-10-CM | POA: Insufficient documentation

## 2016-01-13 DIAGNOSIS — D649 Anemia, unspecified: Secondary | ICD-10-CM | POA: Diagnosis not present

## 2016-01-13 DIAGNOSIS — K279 Peptic ulcer, site unspecified, unspecified as acute or chronic, without hemorrhage or perforation: Secondary | ICD-10-CM

## 2016-01-13 DIAGNOSIS — D509 Iron deficiency anemia, unspecified: Secondary | ICD-10-CM

## 2016-01-13 HISTORY — DX: Seborrheic dermatitis, unspecified: L21.9

## 2016-01-13 MED ORDER — KETOCONAZOLE 2 % EX SHAM: 1.0000 "application " | MEDICATED_SHAMPOO | CUTANEOUS | Status: DC

## 2016-01-13 MED ORDER — FEXOFENADINE HCL 180 MG PO TABS: 180.0000 mg | ORAL_TABLET | Freq: Every day | ORAL | Status: DC

## 2016-01-13 NOTE — Assessment & Plan Note (Signed)
Patient currently asymptomatic. Not doing so well on Zyrtec. May switch to [place]. As discussed with him he can get it OTC, but I will send in prescription.

## 2016-01-13 NOTE — Progress Notes (Signed)
Subjective:     Patient ID: Stanley [name], male   DOB: [birth year], 40 y.o.   MRN: [id number]  HPI Skin rash: here to follow up for scaly rash on his scalp and brow. He had this in the past and used Ketoconazole shampoo which was very effective. HUD:[id number] with all his medications. He is here for follow up. CHY:IFOYDXA has been chronically on Omeprazole. He said he produces a lot of acid which affects him. He does not want to stop his Omeprazole now. Allergy: Uses Zyrtec but it is not helping. Will like to switch to allegra. Anemia: No acute blood loss. Here for follow up.  Current Outpatient Prescriptions on File Prior to Visit  Medication Sig Dispense Refill  . amLODipine (NORVASC) 5 MG tablet TAKE 1 TABLET BY MOUTH EVERY DAY 90 tablet 2  . ARIPiprazole (ABILIFY) 20 MG tablet Take 15 mg by mouth daily. Prescribed by [facility]    . cetirizine (ZYRTEC) 10 MG tablet TAKE 1 TABLET BY MOUTH EVERY DAY 90 tablet 2  . ferrous sulfate 325 (65 FE) MG tablet TAKE 1 TABLET (325 MG TOTAL) BY MOUTH 2 (TWO) TIMES DAILY. (Patient taking differently: TAKE 1 TABLET (325 MG TOTAL) BY MOUTH.) 60 tablet 4  . Fish Oil OIL by Does not apply route.    . methylphenidate (CONCERTA) 36 MG CR tablet Take 1 tablet (36 mg total) by mouth daily. Prescribed by [facility]    . metoprolol tartrate (LOPRESSOR) 25 MG tablet TAKE 1 TABLET BY MOUTH TWICE A DAY FOR BLOOD PRESSURE 60 tablet 5  . Multiple Vitamin (MULTIVITAMIN) tablet Take 1 tablet by mouth daily.      Marland Kitchen omeprazole (PRILOSEC) 20 MG capsule TAKE ONE CAPSULE BY MOUTH TWICE A DAY 60 capsule 3   No current facility-administered medications on file prior to visit.   Past Medical History  Diagnosis Date  . Asperger's disorder   . Osteogenesis imperfecta   . Peptic ulcer   . Depression   . Allergic rhinitis   . Hypertension   . GERD (gastroesophageal reflux disease)   . Psychosis   . Seborrheic dermatitis   . Abnormal PFTs    . Iron deficiency anemia   . Hiatal hernia      Review of Systems  Respiratory: Negative.   Cardiovascular: Negative.   Gastrointestinal: Negative.   Genitourinary: Negative.   Skin: Positive for rash.  All other systems reviewed and are negative.  Filed Vitals:   01/13/16 1049  BP: 143/80  Pulse: 77  Temp: 97.8 F (36.6 C)  TempSrc: Oral  SpO2: 100%       Objective:   Physical Exam  Constitutional: He is oriented to person, place, and time. No distress.  Cardiovascular: Normal rate, regular rhythm and normal heart sounds.   No murmur heard. Pulmonary/Chest: Effort normal and breath sounds normal. No respiratory distress. He has no wheezes. He exhibits no tenderness.  Abdominal: Soft. Bowel sounds are normal. He exhibits no distension and no mass. There is no tenderness.  Musculoskeletal: Normal range of motion. He exhibits no edema.  Neurological: He is alert and oriented to person, place, and time. No cranial nerve deficit.  Skin:     Nursing note and vitals reviewed.      Assessment:     Seborrheic dermatitis HTN PUD Allergy    Anemia Plan:     Check problem list.      NB: HIV screening recommended. He agreed with plan. Test  ordered.

## 2016-01-13 NOTE — Assessment & Plan Note (Signed)
Continue ferrous sulfate. CBC ordered for monitoring.

## 2016-01-13 NOTE — Patient Instructions (Addendum)
  Seborrheic Dermatitis Seborrheic dermatitis involves pink or red skin with greasy, flaky scales. It usually occurs on the scalp, and it is often called dandruff. This condition may also affect the eyebrows, nose, ears, chest, and the bearded area of men's faces. It often occurs where skin has more oil (sebaceous) glands. It may come and go for no known reason, and it is often long-lasting (chronic). CAUSES The cause is not known. RISK FACTORS This condition is more like to develop in:  People who are stressed or tired.  People who have skin conditions, such as acne.  People who have certain conditions, such as:  HIV (human immunodeficiency virus).  AIDS (acquired immunodeficiency syndrome).  Parkinson disease.  An eating disorder.  Stroke.  Depression.  Epilepsy.  Alcoholism.  People who live in places that have extreme weather.  People who have a family history of seborrheic dermatitis.  People who use skin creams that are made with alcohol.  People who are 48-25 years old.  People who take certain medicines. SYMPTOMS Symptoms of this condition include:  Thick scales on the scalp.  Redness on the face or in the armpits.  Skin that is flaky. The flakes may be white or yellow.  Skin that seems oily or dry but is not helped with moisturizers.  Itching or burning in the affected areas. DIAGNOSIS This condition is diagnosed with a medical history and physical exam. A sample of your skin may be tested (skin biopsy). You may need to see a skin specialist (dermatologist). TREATMENT There is no cure for this condition, but treatment can help to manage the symptoms. Treatment may include:  Cortisone (steroid) ointments, creams, and lotions.  Over-the-counter or prescription shampoos. HOME CARE INSTRUCTIONS  Apply over-the-counter and prescription medicines only as told by your health care provider.  Keep all follow-up visits as told by your health care provider.  This is important.  Try to reduce your stress, such as with yoga or mediation. If you need help to reduce stress, ask your health care provider.  Shower or bathe as told by your health care provider.  Use any medicated shampoos as told by your health care provider. SEEK MEDICAL CARE IF:  Your symptoms do not improve with treatment.  Your symptoms get worse.  You have new symptoms.   This information is not intended to replace advice given to you by your health care provider. Make sure you discuss any questions you have with your health care provider.   Document Released: 06/21/2005 Document Revised: 03/12/2015 Document Reviewed: 11/06/2014 Elsevier Interactive Patient Education [employer].

## 2016-01-13 NOTE — Assessment & Plan Note (Signed)
Counseling done about chronic use of PPI. I recommended switching to Ranitidin but he declined. He will continue current regimen.

## 2016-01-13 NOTE — Assessment & Plan Note (Signed)
BP looks good. No change in meds. CMET and lipid profile ordered. Patient prefers to have it done fasting. Future lab order placed. Return for lab.

## 2016-01-13 NOTE — Assessment & Plan Note (Signed)
Recurrent. Improved on Ketoconazole in the past. Med refilled. Avoid contact to the eye discussed. He verbalized understanding. F/U as needed.

## 2016-01-29 ENCOUNTER — Other Ambulatory Visit: Payer: Medicaid Other

## 2016-01-30 ENCOUNTER — Other Ambulatory Visit: Payer: Medicare Other

## 2016-01-30 DIAGNOSIS — Z114 Encounter for screening for human immunodeficiency virus [HIV]: Secondary | ICD-10-CM

## 2016-01-30 DIAGNOSIS — D649 Anemia, unspecified: Secondary | ICD-10-CM

## 2016-01-30 DIAGNOSIS — I10 Essential (primary) hypertension: Secondary | ICD-10-CM

## 2016-01-30 LAB — LIPID PANEL
CHOLESTEROL: 184 mg/dL (ref 125–200)
HDL: 55 mg/dL (ref >=40)
LDL Cholesterol: 112 mg/dL (ref <130)
Total CHOL/HDL Ratio: 3.3 Ratio (ref <=5.0)
Triglycerides: 84 mg/dL (ref <150)
VLDL: 17 mg/dL (ref <30)

## 2016-01-30 LAB — COMPLETE METABOLIC PANEL WITH GFR
ALT: 19 U/L (ref 9–46)
AST: 22 U/L (ref 10–40)
Albumin: 4 g/dL (ref 3.6–5.1)
Alkaline Phosphatase: 84 U/L (ref 40–115)
BUN: 8 mg/dL (ref 7–25)
CALCIUM: 9 mg/dL (ref 8.6–10.3)
CO2: 27 mmol/L (ref 20–31)
CREATININE: 0.36 mg/dL — AB (ref 0.60–1.35)
Chloride: 103 mmol/L (ref 98–110)
GFR, Est African American: >89 mL/min (ref >=60)
GFR, Est Non African American: >89 mL/min (ref >=60)
GLUCOSE: 79 mg/dL (ref 65–99)
Potassium: 4.2 mmol/L (ref 3.5–5.3)
SODIUM: 139 mmol/L (ref 135–146)
Total Bilirubin: 0.5 mg/dL (ref 0.2–1.2)
Total Protein: 7.1 g/dL (ref 6.1–8.1)

## 2016-01-30 LAB — CBC WITH DIFFERENTIAL/PLATELET
BASOS ABS: 0 {cells}/uL (ref 0–200)
Basophils Relative: 0 %
EOS ABS: 256 {cells}/uL (ref 15–500)
Eosinophils Relative: 4 %
HEMATOCRIT: 44.7 % (ref 38.5–50.0)
Hemoglobin: 14.7 g/dL (ref 13.2–17.1)
LYMPHS PCT: 35 %
Lymphs Abs: 2240 cells/uL (ref 850–3900)
MCH: 29.5 pg (ref 27.0–33.0)
MCHC: 32.9 g/dL (ref 32.0–36.0)
MCV: 89.6 fL (ref 80.0–100.0)
MONO ABS: 640 {cells}/uL (ref 200–950)
MPV: 10.6 fL (ref 7.5–12.5)
Monocytes Relative: 10 %
NEUTROS PCT: 51 %
Neutro Abs: 3264 cells/uL (ref 1500–7800)
Platelets: 201 10*3/uL (ref 140–400)
RBC: 4.99 MIL/uL (ref 4.20–5.80)
RDW: 14 % (ref 11.0–15.0)
WBC: 6.4 10*3/uL (ref 3.8–10.8)

## 2016-01-31 LAB — HIV ANTIBODY (ROUTINE TESTING W REFLEX): HIV 1&2 Ab, 4th Generation: NONREACTIVE

## 2016-02-02 ENCOUNTER — Telehealth: Payer: Self-pay | Admitting: Family Medicine

## 2016-02-02 ENCOUNTER — Telehealth: Payer: Self-pay | Admitting: *Deleted

## 2016-02-02 ENCOUNTER — Encounter: Payer: Self-pay | Admitting: *Deleted

## 2016-02-02 ENCOUNTER — Encounter: Payer: Self-pay | Admitting: Family Medicine

## 2016-02-02 NOTE — Telephone Encounter (Signed)
Spoke with patient to inform him his labs were normal. Pt wanted me to call Ernie Hew to have him come by and pick up copies of results, pt does not know the fax number.

## 2016-02-02 NOTE — Telephone Encounter (Signed)
-----   Message from Kinnie Feil, MD sent at 01/31/2016  5:17 PM EDT ----- Please advise patient his test results are normal. Check where he wants the results faxed to and help with faxing. Thanks.

## 2016-02-02 NOTE — Telephone Encounter (Signed)
Dear Stanley [name] Team Please let him know his lab work (including HIV test) was normal. D. Eniola said something about faxing hislab results so see if thre is somewhere he needs them faxed? I am sending him a letter in the mail as well. THANKS! Dorcas Mcmurray

## 2016-02-02 NOTE — Telephone Encounter (Signed)
Spoke with patient and he is aware results.  He would just like his labs printed and placed up front for his caregiver Abbe Amsterdam to pick up for him. Cesareo Vickrey,CMA

## 2016-02-03 ENCOUNTER — Telehealth: Payer: Self-pay | Admitting: Family Medicine

## 2016-02-03 MED ORDER — CETIRIZINE HCL 10 MG PO TABS
10.0000 mg | ORAL_TABLET | Freq: Every day | ORAL | 3 refills | Status: DC
Start: 2016-02-03 — End: 2016-02-03

## 2016-02-03 MED ORDER — FEXOFENADINE HCL 180 MG PO TABS: 180.0000 mg | ORAL_TABLET | Freq: Every day | ORAL | 3 refills | Status: DC

## 2016-02-03 NOTE — Telephone Encounter (Signed)
Patient's Caregiver Phil informed that Rx is ready for pickup.  Derl Barrow, RN

## 2016-02-03 NOTE — Telephone Encounter (Signed)
Will forward to MD to print this script for patient. [name],CMA

## 2016-02-03 NOTE — Telephone Encounter (Signed)
RN TEAM I have on his med list cetirizine (whihc is zyrtec, not allegra) . Please call and make sure they understand the difference.  I have placed the cetririzine rx up front. If that is not correct then they will have to wait until Dr Gwendlyn Deutscher is back because his chart says cetirizine (zyrtec) Stanley [name]

## 2016-02-03 NOTE — Telephone Encounter (Signed)
pts caretaker.  Pt received Rx for Allegra at his last visit.  Caretaker states the facility needs medication order for their files. Please call Stanley [name] when ready and he can pick up

## 2016-02-06 ENCOUNTER — Telehealth: Payer: Self-pay | Admitting: Family Medicine

## 2016-02-06 MED ORDER — FEXOFENADINE HCL 180 MG PO TABS
180.0000 mg | ORAL_TABLET | Freq: Every day | ORAL | 3 refills | Status: DC
Start: 2016-02-06 — End: 2016-08-19

## 2016-02-06 NOTE — Telephone Encounter (Signed)
Printed and given to [employer]

## 2016-02-06 NOTE — Telephone Encounter (Signed)
Will forward to Dr. Erin Hearing who is precepting today to see if he can print this script for patient.  Lab orders are already at the front desk for patient to have his caregiver pick up. Roald Lukacs,CMA

## 2016-02-06 NOTE — Telephone Encounter (Signed)
Pt is calling because he was given a prescription of Allegra. The problem is that care taker needs orders from the doctor since he is in a group home and for them to be able to give this to him. He also had labs done on his last visit, and he needs a copy of the results for himself and caretaker. Patient would like to come and get this today because he can take his medication without the orders. jw

## 2016-02-06 NOTE — Telephone Encounter (Signed)
Spoke with patient and informed him his lab work and rx are both up front ready for pick up.

## 2016-02-11 ENCOUNTER — Other Ambulatory Visit: Payer: Self-pay | Admitting: Family Medicine

## 2016-02-11 DIAGNOSIS — Q780 Osteogenesis imperfecta: Secondary | ICD-10-CM

## 2016-02-11 NOTE — Progress Notes (Signed)
Need PT evaluation for power wheelchair.

## 2016-02-17 ENCOUNTER — Ambulatory Visit: Payer: Medicaid Other | Admitting: Family Medicine

## 2016-02-17 NOTE — Progress Notes (Deleted)
Does the patient require and use a wheelchair to move around in the home?  yes  Does the patient have quadriplegia or a fixed hip angle?  no  Does the patient have a trunk or brace cast or other brace that requires a reclining back feature for positioning? {YES/NO/NOT APPLICABLE:20182}  Does the patient have excessive extensor tone of the trunk muscles?  {YES/NO/NOT APPLICABLE:20182}  Does the patient need to rest in a recumbent position two or more times a day? 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 yes  How many hours per day does the patient usually spend in the  wheelchair? ( round up to the next hour )  ***.  Is the patient able to adequately self-propel in the standard weight wheelchair?  no  If not, would the patient be able to adequately self propel in the  wheelchair being ordered?  yes  The answers to the above questions were provided by:  Patient  WHEEL CHAIR ASSESSMENT Neurological Factor: Muscle Tone ***, Limb movement ***, Reflexes ***   Posture Control: Head***, Trunk***, Limbs***  MSK: Range of Motion***  Functional Assessment: Ambulatory status***, Ambulatory potential*** i.e. not expected Totally dependent on wheelchair ***, Transfer capability***, Feeding assistance***, Dressing assistance***  Environmental Assessment: Home accessibility to wheelchair***, Ramp availability***  Requested Equipment: Motorize/Manual Wheelchair***, Medical Necessity for equipment***  Physician/Therapist name: Title: Date:

## 2016-02-24 ENCOUNTER — Ambulatory Visit: Payer: Medicaid Other | Admitting: Family Medicine

## 2016-02-24 ENCOUNTER — Other Ambulatory Visit: Payer: Self-pay | Admitting: Family Medicine

## 2016-03-09 ENCOUNTER — Encounter: Payer: Self-pay | Admitting: Family Medicine

## 2016-03-09 ENCOUNTER — Ambulatory Visit (INDEPENDENT_AMBULATORY_CARE_PROVIDER_SITE_OTHER): Payer: Medicare Other | Admitting: Family Medicine

## 2016-03-09 DIAGNOSIS — H1010 Acute atopic conjunctivitis, unspecified eye: Secondary | ICD-10-CM | POA: Diagnosis not present

## 2016-03-09 DIAGNOSIS — Z23 Encounter for immunization: Secondary | ICD-10-CM

## 2016-03-09 MED ORDER — OLOPATADINE HCL 0.1 % OP SOLN: 1.0000 [drp] | Freq: Two times a day (BID) | OPHTHALMIC | 2 refills | Status: DC | PRN

## 2016-03-09 NOTE — Patient Instructions (Signed)
Allergic Conjunctivitis A thin, clear membrane (conjunctiva) covers the white part of your eye and the inner surface of your eyelid. Allergic conjunctivitis happens when this membrane gets irritated. This is caused by allergies. Common things (allergens) that can cause an allergic reaction include:  Dust.  Pollen.  Mold.  Animal:  Hair.  Fur.  Skin.  Saliva or other animal fluids. This condition can make your eye red or pink. It can also make your eye feel itchy. This condition cannot be spread by one person to another person (noncontagious).  HOME CARE  Take or apply medicines only as told by your doctor.  Avoid touching or rubbing your eyes.  Apply a cool, clean washcloth to your eye for 10-20 minutes. Do this 3-4 times a day.  If you wear contact lenses, do not wear them until the irritation is gone. Wear glasses in the meantime.  Avoid wearing eye makeup until the irritation is gone.  Try to avoid whatever allergen is causing the allergic reaction. GET HELP IF:  Your symptoms get worse.  You have pus draining from your eyes.  You have new symptoms.  You have a fever.   This information is not intended to replace advice given to you by your health care provider. Make sure you discuss any questions you have with your health care provider.   Document Released: 12/09/2009 Document Revised: 07/12/2014 Document Reviewed: 04/02/2014 Elsevier Interactive Patient Education [employer].

## 2016-03-09 NOTE — Assessment & Plan Note (Signed)
Waxes and Wane.  Currently asymptomatic. Pataday prescribed prn itching and itching. Return precaution discussed.

## 2016-03-09 NOTE — Progress Notes (Signed)
Subjective:     Patient ID: Stanley [name], male   DOB: 1976/05/29, 40 y.o.   MRN: [id number]  Conjunctivitis   The current episode started 5 to 7 days ago (Noticed red left eye last friday). The onset was gradual. The problem has been resolved (Symptoms lasted few hours that friday and then resolved by itself. He has had on and off episodes of watery eyes.). Nothing relieves the symptoms. Nothing aggravates the symptoms. Associated symptoms include congestion and eye redness. Pertinent negatives include no fever, no double vision, no eye itching, no ear pain, no headaches, no sore throat, no eye discharge and no eye pain.   Current Outpatient Prescriptions on File Prior to Visit  Medication Sig Dispense Refill  . amLODipine (NORVASC) 5 MG tablet TAKE 1 TABLET BY MOUTH EVERY DAY 90 tablet 2  . ARIPiprazole (ABILIFY) 20 MG tablet Take 15 mg by mouth daily. Prescribed by [facility]- [facility]    . ferrous sulfate 325 (65 FE) MG tablet TAKE 1 TABLET (325 MG TOTAL) BY MOUTH 2 (TWO) TIMES DAILY. (Patient taking differently: daily) 60 tablet 4  . fexofenadine (ALLEGRA) 180 MG tablet Take 1 tablet (180 mg total) by mouth daily. 90 tablet 3  . Fish Oil OIL by Does not apply route.    Marland Kitchen ketoconazole (NIZORAL) 2 % shampoo Apply 1 application topically 2 (two) times a week. 120 mL 2  . methylphenidate (CONCERTA) 36 MG CR tablet Take 1 tablet (36 mg total) by mouth daily. Prescribed by [facility]- [facility]    . metoprolol tartrate (LOPRESSOR) 25 MG tablet TAKE 1 TABLET BY MOUTH TWICE A DAY FOR BLOOD PRESSURE 60 tablet 5  . Multiple Vitamin (MULTIVITAMIN) tablet Take 1 tablet by mouth daily.      Marland Kitchen omeprazole (PRILOSEC) 20 MG capsule TAKE ONE CAPSULE BY MOUTH TWICE A DAY 60 capsule 3   No current facility-administered medications on file prior to visit.    Past Medical History:  Diagnosis Date  . Abnormal PFTs   . Allergic rhinitis   . Asperger's disorder   . Depression   . GERD  (gastroesophageal reflux disease)   . Hiatal hernia   . Hypertension   . Iron deficiency anemia   . Osteogenesis imperfecta   . Peptic ulcer   . Psychosis   . Seborrheic dermatitis      Review of Systems  Constitutional: Negative for fever.  HENT: Positive for congestion. Negative for ear pain and sore throat.   Eyes: Positive for redness. Negative for double vision, pain, discharge and itching.  Neurological: Negative for headaches.  All other systems reviewed and are negative.      Vitals:   03/09/16 0906  BP: 110/64    Objective:   Physical Exam  Constitutional: He is oriented to person, place, and time. He appears well-developed. No distress.  HENT:  Right Ear: Tympanic membrane and ear canal normal. No drainage.  Left Ear: Tympanic membrane and ear canal normal. No drainage.  Mouth/Throat: Uvula is midline and oropharynx is clear and moist.  Eyes: Conjunctivae, EOM and lids are normal. Pupils are equal, round, and reactive to light.  Cardiovascular: Normal rate, regular rhythm and normal heart sounds.   No murmur heard. Pulmonary/Chest: Effort normal and breath sounds normal. No respiratory distress. He has no wheezes.  Neurological: He is alert and oriented to person, place, and time.  Nursing note and vitals reviewed.      Assessment:     Conjunctivitis  Plan:     Check problem list    Flu shot given today.

## 2016-04-06 ENCOUNTER — Ambulatory Visit: Payer: Medicaid Other | Admitting: Family Medicine

## 2016-04-15 ENCOUNTER — Ambulatory Visit: Payer: Medicaid Other | Admitting: Physical Therapy

## 2016-04-23 ENCOUNTER — Telehealth: Payer: Self-pay | Admitting: Family Medicine

## 2016-04-23 NOTE — Telephone Encounter (Signed)
Didn't receive any new form this week.

## 2016-04-23 NOTE — Telephone Encounter (Signed)
Pt needs his wheelchair evaluation form faxed over ASAP, pt states it is very important. [phone]. Please advise. Thanks! ep

## 2016-04-23 NOTE — Telephone Encounter (Signed)
Will forward to MD to see if form was received through her by fax or if patient brought it during his last visit. Birda Didonato,CMA

## 2016-04-26 NOTE — Telephone Encounter (Signed)
I have his form in my office. Once we get the PT record, we can attach it to the form and fax it. I will get you the old form tomorrow. Thanks.

## 2016-04-26 NOTE — Telephone Encounter (Signed)
I got his wheel chair request form. They need his wheel chair evaluation note. Evaluation should be completed by PT. I did referral to PT few weeks ago. I am yet to receive their note.   Please check with patient if he ever went for PT wheel chair assessment. If not, he need to schedule appointment for assessment so they can make appropriate recommendation. Thanks.

## 2016-04-26 NOTE — Telephone Encounter (Signed)
Spoke with neurorehab with [facility] and they stated that patient ended up cancelling his appointment with them.  He was seen with [facility] for this assessment and [facility] would have received the office note from this.  Can look in care everywhere to print note off.  Do we need to get a new form or do you have an old one?  Anapaula Severt,CMA

## 2016-04-27 NOTE — Telephone Encounter (Signed)
Spoke with Stanley [name] at [facility] outpatient rehab.  She will fax over his visit with them from 01/23/16 and then I will fax this along with order to [employer] [phone]. Stanley [name],CMA

## 2016-06-01 ENCOUNTER — Telehealth: Payer: Self-pay | Admitting: Family Medicine

## 2016-06-01 ENCOUNTER — Ambulatory Visit: Payer: Medicaid Other | Admitting: Family Medicine

## 2016-06-01 NOTE — Telephone Encounter (Signed)
Will look for from today and complete it ASAP.

## 2016-06-01 NOTE — Telephone Encounter (Signed)
Tom from [place], faxed over a from for authorization to give pt over the counter medication awhile back and still has not heard anything. Fax [phone]. Please advise. Thanks! ep

## 2016-06-01 NOTE — Telephone Encounter (Signed)
Form completed today at 1:45pm and was placed at the front office to be faxed. Thanks.

## 2016-06-01 NOTE — Telephone Encounter (Signed)
Will forward to MD to advise on form.  Faigy Stretch,CMA

## 2016-06-08 ENCOUNTER — Encounter: Payer: Self-pay | Admitting: Family Medicine

## 2016-06-08 ENCOUNTER — Ambulatory Visit (INDEPENDENT_AMBULATORY_CARE_PROVIDER_SITE_OTHER): Payer: Medicare Other | Admitting: Family Medicine

## 2016-06-08 VITALS — BP 100/60 | Ht <= 58 in | Wt 70.4 lb

## 2016-06-08 DIAGNOSIS — Q780 Osteogenesis imperfecta: Secondary | ICD-10-CM

## 2016-06-08 DIAGNOSIS — R269 Unspecified abnormalities of gait and mobility: Secondary | ICD-10-CM | POA: Diagnosis not present

## 2016-06-08 NOTE — Assessment & Plan Note (Signed)
Patient stated he is under the care of an orthopedic for this.  He was seen about 3 weeks ago. He stated he had been on Fosamax in the past but was discontinued by his orthopedic doctor and for now do not want him to get back on it. He has had multiple fractures in the past due to fall. No recent fall or fracture. F/U with me as needed. Continue management with his orthopedic. He follows up with his specialist as well for eye exam. He has not had recent ECHO.  I called patient to discussed this with hi after he had left the office.  Cardiac exam was normal without any cardiac related symptoms. As discussed with him, we can consider obtaining follow up ECHO at next visit. He verbalized understanding.

## 2016-06-08 NOTE — Progress Notes (Signed)
CC: WHEEL CHAIR ASSESSMENT  INDICATION/DIAGNOSIS: Osteogenesis Imperfecta  HPI: Here for wheel chair assessment. He needs [facility] for ambulation.  EXAM: Physical Exam  Constitutional: He is oriented to person, place, and time. No distress.  Cardiovascular: Normal rate, regular rhythm and normal heart sounds.   No murmur heard. Pulmonary/Chest: Effort normal and breath sounds normal. No respiratory distress. He has no wheezes.  Abdominal: Soft. Bowel sounds are normal. He exhibits no distension. There is no tenderness.  Musculoskeletal:  On wheel chair. Small stature Multiple joint and bone deformity  Neurological: He is alert and oriented to person, place, and time. He displays no tremor and normal reflexes. No sensory deficit. He exhibits normal muscle tone.  Nursing note and vitals reviewed.    V/S:  Hgt: 3 feet 1 inch                  Weight: 70.4 lbs  FHQ:RFXJ appearing, not in distress.   NEUROLOGIC:  Muscle Tone (Hypertonic, Hypotonic, Normal tone): Normal tone Sensory (Intact, reduced, loss):Intact  MSK: Described ROM, spinal curvature, deformity etc. Upper limbs: Very short upper limb, deformed with limited ROM. Lower limbs: Very short upper limb, deformed with limited ROM. Spine/Back: Abnormal curvature.  POSTURAL CONTROL: (Good, Fair, Poor, None) Head control: Good Trunk Control: Fair Extremity Control: Good  FUNCTIONAL ASSESSMENT: (No ambulatory, with assistance, short distance only, community ambulatory) Ambulatory status: Non ambulatory Is patient totally dependent on Wheelchair? : Yes, If no explain: NA Transfer capability (need assistant, independent): limited. Need assistant most of the time Feeding assistant: Feed self, however meals are prepared for him and need help with cutting meats. Dressing assistant: Yes  ENVIRONMENTAL ASSESSMENT: Home accessible to wheelchair? (Yes/No): Yes Are ramp available at home: No ramp but home is handicap  accessible  EQUIPMENT REQUEST Restaurant manager, fast food): Power Wheel chair  Describe medical necessity for power wheel chair:Need power wheel chair for activities of daily living and ambulation to give him some level of independency.  Is patient physically and mentally capable of operating wheelchair prescribed? : Yes  Provider's name: Andrena Mews, MD, MPH  Provider's Signature:  Date:06/08/16  [facility] ? Family practice physician in [place], [place] Address: [address], [place], [place] [address] Phone: [phone]

## 2016-06-08 NOTE — Assessment & Plan Note (Signed)
Secondary to osteogenesis imperfecta and joint abnormality. Wheel chair assessment completed today. He is dependent on wheelchair for ambulation and completion of daily activities. Prescription given.

## 2016-06-08 NOTE — Patient Instructions (Signed)
Nice seeing you today. Your wheel chair assessment has been completed. We will fax note to [employer] at your request. F/U as needed.

## 2016-07-09 ENCOUNTER — Telehealth: Payer: Self-pay | Admitting: *Deleted

## 2016-07-09 NOTE — Telephone Encounter (Signed)
Spoke with patient and informed him that provider received another request identical to what we have already filled out.  He states that he is waiting for the approval and then they will get him the chair.  Patient hasn't heard anything from them asking for anything from provider.  Patient would just like provider to sign form again to just be on the safe side.  Will forward to MD. [name]

## 2016-07-09 NOTE — Telephone Encounter (Signed)
-----   Message from Kinnie Feil, MD sent at 07/07/2016  7:14 AM EST ----- Please contact patient for information.  I got another wheel chair request for him. We just completed all required process for him to get one about 1 month ago. What else are they ( [employer]) needing?  Ask if patient already got his wheel chair from them or is he still having issues with getting it?  Stanley [name]

## 2016-07-10 NOTE — Telephone Encounter (Signed)
Will take care of it on Monday.

## 2016-07-12 NOTE — Telephone Encounter (Signed)
I called [employer] and spoke with one of their reps. Although the encounter note from Dec 5th was placed at the front office to be faxed to them, they claimed they never got it. This was reprinted and placed at the front office for faxing.  I verified their faxed number with the person I spoke with.

## 2016-07-14 ENCOUNTER — Other Ambulatory Visit: Payer: Self-pay | Admitting: Family Medicine

## 2016-08-12 ENCOUNTER — Telehealth: Payer: Self-pay | Admitting: Family Medicine

## 2016-08-12 ENCOUNTER — Other Ambulatory Visit: Payer: Self-pay | Admitting: Family Medicine

## 2016-08-12 NOTE — Telephone Encounter (Signed)
I called and spoke with [facility]. Although the actual face to face date was 06/08/17, she want me to put the date the PT evaluation was signed by me which was 08/04/16. Date corrected and note placed up front for faxing.

## 2016-08-12 NOTE — Telephone Encounter (Signed)
Will forward to MD who will be receiving the form once it comes. [name],CMA

## 2016-08-12 NOTE — Telephone Encounter (Signed)
[employer]: will be faxing back a form that needs to be corrected.  Please call Brooke at [phone] so she can explain how it needs to be corrected.

## 2016-08-19 ENCOUNTER — Other Ambulatory Visit: Payer: Self-pay | Admitting: Family Medicine

## 2016-08-29 ENCOUNTER — Other Ambulatory Visit: Payer: Self-pay | Admitting: Family Medicine

## 2016-09-09 ENCOUNTER — Other Ambulatory Visit: Payer: Self-pay | Admitting: Family Medicine

## 2016-09-09 NOTE — Telephone Encounter (Signed)
Patient has refill already at the pharmacy. Please have him contact pharmacy for his med.

## 2016-09-10 NOTE — Telephone Encounter (Signed)
Left detailed message informing pt his norvasc has a refill at the pharmacy. Pt should contact them first, if still unable to fill, pt is to call the office on Monday.

## 2017-01-11 ENCOUNTER — Encounter: Payer: Medicare Other | Admitting: Family Medicine

## 2017-01-20 ENCOUNTER — Other Ambulatory Visit: Payer: Self-pay | Admitting: Family Medicine

## 2017-01-20 MED ORDER — MULTIVITAMIN MEN PO TABS: 1.0000 | ORAL_TABLET | Freq: Every day | ORAL | 99 refills | Status: DC

## 2017-01-20 NOTE — Telephone Encounter (Signed)
Stanley [name] from [place] requested prescription for daily MVI use. Script printed and faxed to [phone]

## 2017-03-29 ENCOUNTER — Encounter: Payer: Self-pay | Admitting: Family Medicine

## 2017-03-29 ENCOUNTER — Ambulatory Visit (INDEPENDENT_AMBULATORY_CARE_PROVIDER_SITE_OTHER): Payer: Medicare Other | Admitting: Family Medicine

## 2017-03-29 VITALS — BP 108/64 | HR 74 | Temp 97.9°F | Wt <= 1120 oz

## 2017-03-29 DIAGNOSIS — Z23 Encounter for immunization: Secondary | ICD-10-CM | POA: Diagnosis present

## 2017-03-29 DIAGNOSIS — I10 Essential (primary) hypertension: Secondary | ICD-10-CM

## 2017-03-29 DIAGNOSIS — Z0000 Encounter for general adult medical examination without abnormal findings: Secondary | ICD-10-CM

## 2017-03-29 DIAGNOSIS — Q780 Osteogenesis imperfecta: Secondary | ICD-10-CM

## 2017-03-29 NOTE — Patient Instructions (Signed)

## 2017-03-29 NOTE — Progress Notes (Signed)
Subjective:     Stanley [name] is a 41 y.o. male and is here for a comprehensive physical exam. The patient reports no problems.  Social History   Social History  . Marital status: Single    Spouse name: N/A  . Number of children: N/A  . Years of education: N/A   Occupational History  . Disabled     lives in group home, non ambulatory   Social History Main Topics  . Smoking status: Former Smoker    Quit date: 06/04/2004  . Smokeless tobacco: Never Used  . Alcohol use Yes     Comment: occasional  . Drug use: No  . Sexual activity: Not on file   Other Topics Concern  . Not on file   Social History Narrative  . No narrative on file   Health Maintenance  Topic Date Due  . INFLUENZA VACCINE  02/02/2017  . TETANUS/TDAP  06/14/2021  . HIV Screening  Completed    The following portions of the patient's history were reviewed and updated as appropriate: allergies, current medications, past family history, past medical history, past social history, past surgical history and problem list.  Review of Systems Pertinent items noted in HPI and remainder of comprehensive ROS otherwise negative.   Objective:    BP 108/64   Pulse 74   Temp 97.9 F (36.6 C) (Oral)   Wt 63 lb (28.6 kg)   BMI 32.35 kg/m  General appearance: alert and cooperative Head: asymmetric shape, atraumatic Eyes: conjunctivae/corneas clear. PERRL, EOM's intact. Fundi benign. Ears: normal TM's and external ear canals both ears and cerumen impaction B/L. No discharge, no tenderness, no erythema of the EAC Throat: lips, mucosa, and tongue normal; teeth and gums normal Neck: no adenopathy, no carotid bruit, no JVD, supple, symmetrical, trachea midline and thyroid not enlarged, symmetric, no tenderness/mass/nodules Back: short spine Lungs: wheezes bilaterally and improved some with clearing throat Heart: regular rate and rhythm, S1, S2 normal, no murmur, click, rub or gallop Abdomen: soft, non-tender; bowel  sounds normal; no masses,  no organomegaly Extremities: extremities normal, atraumatic, no cyanosis or edema Skin: Skin color, texture, turgor normal. No rashes or lesions Neurologic: Alert and oriented X 3, normal strength and tone. Normal symmetric reflexes. Normal coordination and gait    Assessment:    Healthy male exam. No acute findings from baseline.       Plan:  Debrox recommended for cerumen impaction. Flu shot given today. Osteogenesis imperfection routine surveillance discussed. Bmet to assess kidney function. She had recent eye exam. No hearing concern. ECHO and Bone density scan ordered. Referral to Dr. Valentina Lucks for PFT which he should obtain every 2 yrs. He is also wheezing intermittently. Continue nutrition counseling with Dr. Jenne Campus  Note: He self d/c follow-up with [facility]. He stated he does not feel the need to return to them. I did medication review and counseling today. I again advised him that Omeprazole can cause fracture from bone loss as well as kidney impairment. He stated that is the only thing that works for his reflux and will like to continue it.   See After Visit Summary for Counseling Recommendations

## 2017-03-30 ENCOUNTER — Telehealth: Payer: Self-pay

## 2017-03-30 LAB — BASIC METABOLIC PANEL
BUN / CREAT RATIO: 18 (ref 9–20)
BUN: 7 mg/dL (ref 6–24)
CO2: 26 mmol/L (ref 20–29)
CREATININE: 0.38 mg/dL — AB (ref 0.76–1.27)
Calcium: 9.3 mg/dL (ref 8.7–10.2)
Chloride: 95 mmol/L — ABNORMAL LOW (ref 96–106)
GFR calc Af Amer: 175 mL/min/{1.73_m2} (ref >59)
GFR calc non Af Amer: 152 mL/min/{1.73_m2} (ref >59)
Glucose: 91 mg/dL (ref 65–99)
Potassium: 4.9 mmol/L (ref 3.5–5.2)
SODIUM: 137 mmol/L (ref 134–144)

## 2017-03-30 NOTE — Telephone Encounter (Signed)
Called all numbers and unable to reach anyone or leave a VM. Please inform him or his caregiver of normal kidney function.

## 2017-03-30 NOTE — Telephone Encounter (Signed)
-----   Message from Kinnie Feil, MD sent at 03/30/2017  9:57 AM EDT ----- Please inform patient that his kidney function is at baseline and looks ok.

## 2017-04-01 ENCOUNTER — Ambulatory Visit (HOSPITAL_COMMUNITY): Admission: RE | Admit: 2017-04-01 | Payer: Medicare Other | Source: Ambulatory Visit

## 2017-04-08 ENCOUNTER — Ambulatory Visit (HOSPITAL_COMMUNITY): Payer: Medicare Other

## 2017-04-08 ENCOUNTER — Ambulatory Visit: Payer: Medicare Other | Admitting: Pharmacist

## 2017-04-15 ENCOUNTER — Ambulatory Visit (HOSPITAL_COMMUNITY): Payer: Medicare Other | Attending: Family Medicine

## 2017-04-20 ENCOUNTER — Other Ambulatory Visit: Payer: Medicare Other

## 2017-04-21 ENCOUNTER — Ambulatory Visit: Payer: Medicaid Other | Admitting: Pharmacist

## 2017-04-24 ENCOUNTER — Other Ambulatory Visit: Payer: Self-pay | Admitting: Family Medicine

## 2017-05-17 ENCOUNTER — Other Ambulatory Visit: Payer: Self-pay | Admitting: Family Medicine

## 2017-05-17 DIAGNOSIS — Q780 Osteogenesis imperfecta: Secondary | ICD-10-CM

## 2017-05-30 ENCOUNTER — Inpatient Hospital Stay: Admission: RE | Admit: 2017-05-30 | Payer: Medicaid Other | Source: Ambulatory Visit

## 2017-06-12 ENCOUNTER — Other Ambulatory Visit: Payer: Self-pay | Admitting: Family Medicine

## 2017-07-22 ENCOUNTER — Ambulatory Visit (INDEPENDENT_AMBULATORY_CARE_PROVIDER_SITE_OTHER): Payer: Medicare Other | Admitting: Family Medicine

## 2017-07-22 ENCOUNTER — Other Ambulatory Visit: Payer: Self-pay

## 2017-07-22 ENCOUNTER — Encounter: Payer: Self-pay | Admitting: Family Medicine

## 2017-07-22 DIAGNOSIS — Q780 Osteogenesis imperfecta: Secondary | ICD-10-CM

## 2017-07-22 DIAGNOSIS — F339 Major depressive disorder, recurrent, unspecified: Secondary | ICD-10-CM

## 2017-07-22 NOTE — Progress Notes (Signed)
Subjective:     Patient ID: Stanley [name], male   DOB: 03-16-1976, 42 y.o.   MRN: [id number]  HPI OI: Here to be evaluated for completion of a certificate of medical necessity for Bidet, this will help with his bowel hygiene since he has difficulty with self cleaning of his bowel. No new concern. He missed his PFT and ECHO appointment which is part of his routine OI monitoring. He plans on rescheduling. Major Depression: He is compliant with his meds. No new concern.  Current Outpatient Medications on File Prior to Visit  Medication Sig Dispense Refill  . amLODipine (NORVASC) 5 MG tablet TAKE 1 TABLET EVERY DAY 90 tablet 2  . ARIPiprazole (ABILIFY) 20 MG tablet Take 15 mg by mouth daily. Prescribed by [facility]- mental health    . escitalopram (LEXAPRO) 10 MG tablet Take 1 tablet by mouth.    . ferrous sulfate 325 (65 FE) MG tablet TAKE 1 TABLET (325 MG TOTAL) BY MOUTH 2 (TWO) TIMES DAILY. (Patient taking differently: daily) 60 tablet 4  . fexofenadine (ALLEGRA) 180 MG tablet TAKE 1 TABLET (180 MG TOTAL) BY MOUTH DAILY. 90 tablet 3  . Fish Oil OIL Take 1 tablet by mouth 2 (two) times daily.     Marland Kitchen ketoconazole (NIZORAL) 2 % shampoo Apply 1 application topically 2 (two) times a week. 120 mL 2  . methylphenidate (CONCERTA) 36 MG CR tablet Take 1 tablet (36 mg total) by mouth daily. Prescribed by [facility]- [facility]    . metoprolol tartrate (LOPRESSOR) 25 MG tablet TAKE 1 TABLET TWICE A DAY FOR BLOOD PRESSURE 60 tablet 5  . Multiple Vitamins-Minerals (MULTIVITAMIN MEN) TABS Take 1 tablet by mouth daily. 30 tablet prn  . omeprazole (PRILOSEC) 20 MG capsule TAKE ONE CAPSULE BY MOUTH TWICE A DAY 180 capsule 3  . olopatadine (PATANOL) 0.1 % ophthalmic solution Place 1 drop into both eyes 2 (two) times daily as needed for allergies. (Patient not taking: Reported on 03/29/2017) 5 mL 2   No current facility-administered medications on file prior to visit.    Past Medical History:  Diagnosis  Date  . Abnormal PFTs   . Allergic rhinitis   . Asperger's disorder   . Depression   . GERD (gastroesophageal reflux disease)   . Hiatal hernia   . Hypertension   . Iron deficiency anemia   . Osteogenesis imperfecta   . Peptic ulcer   . Psychosis ([place])   . Seborrheic dermatitis      Review of Systems  Respiratory: Negative.   Cardiovascular: Negative.   Gastrointestinal: Negative.   Musculoskeletal: Negative.   All other systems reviewed and are negative.      Objective:   Physical Exam  Constitutional: He is oriented to person, place, and time. He appears well-developed. No distress.  Cardiovascular: Normal rate, regular rhythm and normal heart sounds.  No murmur heard. Pulmonary/Chest: Effort normal and breath sounds normal. No respiratory distress. He has no wheezes.  Abdominal: Soft. Bowel sounds are normal. He exhibits no distension and no mass. There is no tenderness.  Musculoskeletal: He exhibits deformity.  Wheelchair bound  Neurological: He is alert and oriented to person, place, and time.  Psychiatric: He has a normal mood and affect. His behavior is normal. Judgment and thought content normal.  Nursing note and vitals reviewed.  Depression screen [facility] 2/9 07/22/2017  Decreased Interest 0  Down, Depressed, Hopeless 0  PHQ - 2 Score 0  Altered sleeping 0  Tired, decreased  energy 0  Change in appetite 0  Feeling bad or failure about yourself  0  Trouble concentrating 0  Moving slowly or fidgety/restless 0  Suicidal thoughts 0  PHQ-9 Score 0       Assessment:     Osteogenesis Imperfecta Major depression    Plan:     Check problem list

## 2017-07-22 NOTE — Assessment & Plan Note (Signed)
No acute change. PHQ9 done today is reassuring. Continue lexapro 10 mg qd and Abilify 20 mg qd. F/U Psych as planned.

## 2017-07-22 NOTE — Assessment & Plan Note (Signed)
No acute change in his symptoms. He will reschedule ECHO and PFT appointment. I will complete his Certificate of Necessity form for Bidet once I get it. F/U as needed.

## 2017-07-22 NOTE — Patient Instructions (Signed)
It was nice seeing you today. Please continue current medication for your health (HTN/Depression). I will complete your forms once I get them. Follow-up with me in the next 6 month.

## 2017-07-25 ENCOUNTER — Ambulatory Visit (INDEPENDENT_AMBULATORY_CARE_PROVIDER_SITE_OTHER): Payer: Medicare Other | Admitting: Pharmacist

## 2017-07-25 ENCOUNTER — Encounter: Payer: Self-pay | Admitting: Pharmacist

## 2017-07-25 DIAGNOSIS — Q780 Osteogenesis imperfecta: Secondary | ICD-10-CM | POA: Diagnosis not present

## 2017-07-25 NOTE — Patient Instructions (Signed)
It was great seeing you today!  Your pulmonary function tests showed normal values, and we'll document this as your baseline in your file.  Follow up with Rx clinic in 1 year.

## 2017-07-25 NOTE — Assessment & Plan Note (Signed)
Spirometry evaluation normal lung function (without any bronchodilator therapy).  No change in treatment plan at this time.  Reviewed results of pulmonary function tests.  Written patient instructions and note for his place of care provided.

## 2017-07-25 NOTE — Progress Notes (Signed)
   S:    Patient arrives in good spirits in wheelchair accompanied by his "AID" - Phil.Marland Kitchen Presents for lung function evaluation.  Patient was referred on 07/22/2017 when last seen by Primary Care Provider Dr. Gwendlyn Deutscher. Patient reports breathing has been normal - denies current symptoms or shortness of breath.   Denies use of any albuterol.   O:  See "scanned report" or Documentation Flowsheet (discrete results - PFTs) for  Spirometry results. Patient provided good effort while attempting spirometry.  Note: No comparator group exists in the database for this patient's height which then does not allow [place] to have percent scores.    Lung Age = 63  A/P: Spirometry evaluation normal lung function (without any bronchodilator therapy).  No change in treatment plan at this time.  Reviewed results of pulmonary function tests.  Written patient instructions and note for his place of care provided.   F/U Clinic visit PRN.   Total time in face to face counseling 20 minutes.  Patient seen with Onnie Boer, PharmD Candidate. Marland Kitchen

## 2017-08-05 ENCOUNTER — Telehealth: Payer: Self-pay | Admitting: Family Medicine

## 2017-08-05 ENCOUNTER — Telehealth (HOSPITAL_COMMUNITY): Payer: Self-pay | Admitting: Family Medicine

## 2017-08-05 ENCOUNTER — Ambulatory Visit (HOSPITAL_COMMUNITY): Payer: Medicare Other | Attending: Cardiology

## 2017-08-05 ENCOUNTER — Other Ambulatory Visit: Payer: Self-pay

## 2017-08-05 ENCOUNTER — Other Ambulatory Visit: Payer: Self-pay | Admitting: Family Medicine

## 2017-08-05 DIAGNOSIS — Q780 Osteogenesis imperfecta: Secondary | ICD-10-CM

## 2017-08-05 DIAGNOSIS — I10 Essential (primary) hypertension: Secondary | ICD-10-CM | POA: Insufficient documentation

## 2017-08-05 LAB — ECHOCARDIOGRAM COMPLETE
CHL CUP MV DEC (S): 144
E decel time: 144 msec
EERAT: 5.7
FS: 47 % — AB (ref 28–44)
IVS/LV PW RATIO, ED: 0.71
LA diam end sys: 19 mm
LADIAMINDEX: 2.11 cm/m2
LASIZE: 19 mm
LV E/e' medial: 5.7
LV TDI E'LATERAL: 11.5
LV TDI E'MEDIAL: 7.72
LVEEAVG: 5.7
LVELAT: 11.5 cm/s
LVOT VTI: 14.8 cm
LVOT peak vel: 84.2 cm/s
Lateral S' vel: 9.14 cm/s
MV pk A vel: 66.9 m/s
MVPKEVEL: 65.5 m/s
PW: 7 mm — AB (ref 0.6–1.1)

## 2017-08-05 NOTE — Telephone Encounter (Signed)
Will forward to MD to place this referral. Stanley [name]

## 2017-08-05 NOTE — Telephone Encounter (Signed)
[facility] called and needs a referral placed for this pt before his appt this afternoon at 3:00. Please advise

## 2017-08-05 NOTE — Telephone Encounter (Signed)
User: Cherie Dark A Date/time: 08/05/17 8:51 AM  Comment: Called and lmsg for EC to call back to r/s pt's appt due to the tech having an emergency doctor's appt.  Context:  Outcome: Left Message  Phone number: [phone] Phone Type: Home Phone  Comm. type: Telephone Call type: Outgoing  Contact: Poole,Anthony Relation to patient: Emergency Contact    User: Cherie Dark A Date/time: 08/05/17 7:39 AM  Comment: CAlled pt and lmsg for him to CB to r/s his 9:30 appt to a later time  Context:  Outcome: Left Message  Phone number: [phone] Phone Type: Home Phone  Comm. type: Telephone Call type: Outgoing  Contact: Stanley [name] Relation to patient: Self

## 2017-08-08 ENCOUNTER — Telehealth: Payer: Self-pay | Admitting: Family Medicine

## 2017-08-08 NOTE — Telephone Encounter (Signed)
HIPPA compliant message left to call me back.   I wanted to discuss results with him as well to let him know that I did not get any form to complete on his behalf per his last visit's discussion.

## 2017-08-15 ENCOUNTER — Inpatient Hospital Stay: Admission: RE | Admit: 2017-08-15 | Payer: Medicaid Other | Source: Ambulatory Visit

## 2017-08-29 ENCOUNTER — Other Ambulatory Visit: Payer: Medicaid Other

## 2017-09-12 ENCOUNTER — Other Ambulatory Visit: Payer: Medicaid Other

## 2017-09-21 ENCOUNTER — Ambulatory Visit (INDEPENDENT_AMBULATORY_CARE_PROVIDER_SITE_OTHER): Payer: Medicare Other | Admitting: Orthopaedic Surgery

## 2017-09-21 ENCOUNTER — Encounter (INDEPENDENT_AMBULATORY_CARE_PROVIDER_SITE_OTHER): Payer: Self-pay | Admitting: Orthopaedic Surgery

## 2017-09-21 ENCOUNTER — Ambulatory Visit (INDEPENDENT_AMBULATORY_CARE_PROVIDER_SITE_OTHER): Payer: Medicare Other

## 2017-09-21 DIAGNOSIS — M25551 Pain in right hip: Secondary | ICD-10-CM | POA: Diagnosis not present

## 2017-09-23 ENCOUNTER — Encounter (INDEPENDENT_AMBULATORY_CARE_PROVIDER_SITE_OTHER): Payer: Self-pay | Admitting: Orthopaedic Surgery

## 2017-09-23 NOTE — Progress Notes (Signed)
Office Visit Note   Patient: Stanley [name]           Date of Birth: [birth year]           MRN: [id number] Visit Date: 09/21/2017              Requested by: Kinnie Feil, MD [address] [place], [place] [address] PCP: Kinnie Feil, MD   Assessment & Plan: Visit Diagnoses:  1. Pain in right hip     Plan: X-rays note multiple bone deformities from his previous fractures surgeries.  No new fracture is seen.  Follow-Up Instructions: Return if symptoms worsen or fail to improve.    xrays show no new fracture.  Orders:  Orders Placed This Encounter  Procedures  . XR HIP UNILAT W OR W/O PELVIS 2-3 VIEWS RIGHT   No orders of the defined types were placed in this encounter.     Procedures: No procedures performed   Clinical Data: No additional findings.   Subjective: Chief Complaint  Patient presents with  . 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  Cold was doing a lot of coughing and then had increased pain in his hip.  He is noticed particular stiffness in the morning when he first wakes up.  With motion.  He is used Tylenol with some relief.  Wheelchair caregiver.  Review of Systems 14 point review of systems updated and reviewed changed previous office visits other than as mentioned above.  He has full-time care.   Objective: Vital Signs: There were no vitals taken for this visit.  Physical Exam  Constitutional: He is oriented to person, place, and time.  Wheelchair-bound with multiple deformities upper and lower extremities consistent with  Congenital OI .   HENT:  Head: Normocephalic and atraumatic.  Eyes: Pupils are equal, round, and reactive to light. EOM are normal.  Neck: No tracheal deviation present. No thyromegaly present.  Cardiovascular: Normal rate.    Pulmonary/Chest: Effort normal. He has no wheezes.  Abdominal: Soft. Bowel sounds are normal.  Neurological: He is alert and oriented to person, place, and time.  Skin: Skin is warm and dry. Capillary refill takes less than 2 seconds.  Psychiatric: He has a normal mood and affect. His behavior is normal. Judgment and thought content normal.    Ortho Exam deformity of the right femur no pain with hip ability.  No tenderness at the ischial tuberosity.  Right hip remains in figure 4 position which is his normal position.  No ecchymosis or skin lesions is noted.  Specialty Comments:  No specialty comments available.  Imaging: No results found.   PMFS History: Patient Active Problem List   Diagnosis Date Noted  . Seborrheic dermatitis 01/13/2016  . Asperger's disorder   . Anemia, iron deficiency 08/14/2012  . Fracture 01/12/2012  . Gait difficulty 08/27/2010  . VISION IMPAIR BOTH EYES IMPAIR LEVEL NFS 05/22/2010  . Fecal urgency 05/14/2010  . OTHER URINARY INCONTINENCE 07/03/2007  . LOSS, HEARING NOS 03/16/2007  . OSTEOGENESIS IMPERFECTA, CONGENITAL 09/08/2006  . Major depressive disorder, recurrent episode ([place]) 09/01/2006  . PSYCHOSIS, UNSPECIFIED 09/01/2006  . HYPERTENSION, BENIGN SYSTEMIC 09/01/2006  . Allergic rhinitis 09/01/2006  . Peptic ulcer 09/01/2006   Past Medical History:  Diagnosis Date  . Abnormal PFTs   . Allergic rhinitis   .  Asperger's disorder   . Depression   . GERD (gastroesophageal reflux disease)   . Hiatal hernia   . Hypertension   . Iron deficiency anemia   . Osteogenesis imperfecta   . Peptic ulcer   . Psychosis ([place])   . Seborrheic dermatitis     Family History  Adopted: Yes    Past Surgical History:  Procedure Laterality Date  . DISTRACTION OSTEOGENESIS     22 surgeries for osetogenesis imperfecta per patient-rods placed   Social History   Occupational History  . Occupation: Disabled    Comment: lives in group home, non ambulatory   Tobacco Use  . Smoking status: Former Smoker    Packs/day: 1.00    Years: 4.00    Pack years: 4.00    Start date: 07/05/2002  . Smokeless tobacco: Never Used  Substance and Sexual Activity  . Alcohol use: Yes    Comment: occasional  . Drug use: No  . Sexual activity: Not on file

## 2017-10-09 NOTE — Progress Notes (Deleted)
   [facility] Phone: [phone]   Date of Visit: 10/10/2017   HPI:  ***  ROS: See HPI.  [place]:  PMH: Osteogenesis Imperfecta HTN Allergic Rhinitis Peptic Ulcer Hearing Loss MDD Asperger's DO  PHYSICAL EXAM: There were no vitals taken for this visit. Gen: *** HEENT: *** Heart: *** Lungs: *** Neuro: *** Ext: ***  ASSESSMENT/PLAN:  Health maintenance:  -***  No problem-specific Assessment & Plan notes found for this encounter.  FOLLOW UP: Follow up in *** for ***  Smiley Houseman, MD PGY [place]

## 2017-10-10 ENCOUNTER — Ambulatory Visit: Payer: Medicare Other | Admitting: Internal Medicine

## 2017-10-10 ENCOUNTER — Inpatient Hospital Stay: Admission: RE | Admit: 2017-10-10 | Payer: Medicaid Other | Source: Ambulatory Visit

## 2017-10-12 NOTE — Progress Notes (Signed)
   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 I told him I would have to look into this.   ROS: See HPI.  [place]:  PMH:  HTN Osteogenesis Imperfecta, Congenital wheelchair dependent  MDD Vision Impariment  Urinary Incontinence Asperger's DO   PHYSICAL EXAM: BP 104/60   Temp 98.1 F (36.7 C) (Oral)   Wt 64 lb (29 kg)   BMI 32.87 kg/m  Gen: NAD, sitting in wheelchair, pleasant   ASSESSMENT/PLAN:  Encounter for completion of form with patient Form completed and given to patient.  It appears that patient prefers not to see a specialist anymore for his OI. PCP ordered bone density scan for monitoring.   Smiley Houseman, MD PGY [place]

## 2017-10-14 ENCOUNTER — Other Ambulatory Visit: Payer: Self-pay

## 2017-10-14 ENCOUNTER — Ambulatory Visit (INDEPENDENT_AMBULATORY_CARE_PROVIDER_SITE_OTHER): Payer: Medicare Other | Admitting: Internal Medicine

## 2017-10-14 VITALS — BP 104/60 | Temp 98.1°F | Wt <= 1120 oz

## 2017-10-14 DIAGNOSIS — Z0289 Encounter for other administrative examinations: Secondary | ICD-10-CM | POA: Diagnosis not present

## 2017-10-14 NOTE — Patient Instructions (Signed)
Thank you for coming in   I will look in to why Dr. Gwendlyn Deutscher wanted the bone density scan and give you a call.

## 2017-10-17 ENCOUNTER — Encounter: Payer: Self-pay | Admitting: Internal Medicine

## 2017-10-17 ENCOUNTER — Telehealth: Payer: Self-pay | Admitting: *Deleted

## 2017-10-17 NOTE — Telephone Encounter (Signed)
-----   Message from Smiley Houseman, MD sent at 10/17/2017  9:15 AM EDT ----- Please inform patient's care taker that Dr. Gwendlyn Deutscher likely ordered the bone density scan to see if his bone thinning is worsening. Depending on the results, she may discuss the benefits of starting a medication to help this.

## 2017-10-17 NOTE — Telephone Encounter (Signed)
LM for patient to call back. [name],CMA  

## 2017-10-18 NOTE — Telephone Encounter (Signed)
Spoke with patient and caregiver Abbe Amsterdam.  Patient agreed to get this test done and Abbe Amsterdam will call to get it scheduled.  [name],CMA

## 2017-10-20 MED ORDER — FEXOFENADINE HCL 180 MG PO TABS: 180.0000 mg | ORAL_TABLET | Freq: Every day | ORAL | 0 refills | Status: DC

## 2017-10-24 ENCOUNTER — Other Ambulatory Visit: Payer: Self-pay | Admitting: *Deleted

## 2017-11-02 ENCOUNTER — Other Ambulatory Visit: Payer: Self-pay | Admitting: Family Medicine

## 2017-12-05 ENCOUNTER — Ambulatory Visit
Admission: RE | Admit: 2017-12-05 | Discharge: 2017-12-05 | Disposition: A | Discharge status: Home / Self Care | Payer: Medicare Other | Source: Ambulatory Visit | Attending: Family Medicine | Admitting: Family Medicine

## 2017-12-05 DIAGNOSIS — Q780 Osteogenesis imperfecta: Secondary | ICD-10-CM

## 2017-12-06 ENCOUNTER — Telehealth: Payer: Self-pay | Admitting: Family Medicine

## 2017-12-06 NOTE — Telephone Encounter (Signed)
HIPPA compliant message left.

## 2017-12-14 ENCOUNTER — Telehealth: Payer: Self-pay | Admitting: *Deleted

## 2017-12-14 MED ORDER — CALCIUM CARBONATE-VITAMIN D 500-200 MG-UNIT PO TABS: 1.0000 | ORAL_TABLET | Freq: Two times a day (BID) | ORAL | 99 refills | Status: DC

## 2017-12-14 NOTE — Telephone Encounter (Signed)
Patient's bone density results show osteopenia.  Needs order to take calcium with Vit. D or Oscal.  Will route request to Dr. Gwendlyn Deutscher.  Burna Forts, BSN, RN-BC

## 2017-12-14 NOTE — Telephone Encounter (Signed)
I called and spoke with Laroy Apple. She asked that I fax prescription for Oscal D for Osteopenia to [phone].  I called and spoke with Sylvan to confirm that it is ok to fax this prescription to [facility] and he said it is ok.  Morey Hummingbird is Dealer at the place where he lives and overseas his medications.

## 2018-01-06 ENCOUNTER — Other Ambulatory Visit: Payer: Self-pay | Admitting: Family Medicine

## 2018-02-07 ENCOUNTER — Ambulatory Visit: Payer: Medicare Other | Admitting: Family Medicine

## 2018-02-16 ENCOUNTER — Other Ambulatory Visit: Payer: Self-pay | Admitting: Family Medicine

## 2018-02-20 ENCOUNTER — Telehealth: Payer: Self-pay | Admitting: Family Medicine

## 2018-02-20 NOTE — Telephone Encounter (Signed)
Will forward to MD. [name],CMA  

## 2018-02-20 NOTE — Telephone Encounter (Signed)
Pt needs a power cord replaced for his wheelchair. According to the [place], drs orders are needed for the replacement.  [employer]  [phone] option 3.  Please advise.

## 2018-02-21 ENCOUNTER — Other Ambulatory Visit: Payer: Self-pay | Admitting: Family Medicine

## 2018-02-21 DIAGNOSIS — Q780 Osteogenesis imperfecta: Secondary | ICD-10-CM

## 2018-02-21 DIAGNOSIS — R269 Unspecified abnormalities of gait and mobility: Secondary | ICD-10-CM

## 2018-02-21 NOTE — Telephone Encounter (Signed)
Please confirm the fax number for the order and let me know. Thanks.

## 2018-02-21 NOTE — Telephone Encounter (Signed)
Fax number for [employer] for orders is [phone].  Dannel Rafter,CMA

## 2018-02-21 NOTE — Telephone Encounter (Signed)
[name], CMA  You 6 hours ago (10:27 AM)      Fax number for [employer] for orders is [phone].  [name],CMA       Documentation     You  [facility] 13 hours ago (4:10 AM)      Please confirm the fax number for the order and let me know. Thanks.      Documentation     [name], [name], CMA  You Yesterday (2:18 PM)      Will forward to MD. [name]       Documentation     [name] routed conversation to [name] (2:15 PM)    [name] Yesterday (2:12 PM)      Pt needs a power cord replaced for his wheelchair. According to the [place], drs orders are needed for the replacement.  [employer]  [phone] option 3.  Please advise.       Documentation      Rx completed.

## 2018-02-21 NOTE — Telephone Encounter (Signed)
Completed.

## 2018-02-28 ENCOUNTER — Telehealth: Payer: Self-pay | Admitting: Family Medicine

## 2018-02-28 DIAGNOSIS — Q780 Osteogenesis imperfecta: Secondary | ICD-10-CM

## 2018-02-28 DIAGNOSIS — R269 Unspecified abnormalities of gait and mobility: Secondary | ICD-10-CM

## 2018-02-28 NOTE — Telephone Encounter (Addendum)
Letter received from [facility] requesting script for patient lift. I called him to clarify what he is needing. He handed the phone over to Sam his new AFL. Sam stated that he needs a [place] lift to move him around at home from bedroom to bathroom etc to ensure safety and prevent physical impact on direct impact on care staff.    They requested a letter of recommendation for the item and script.  Letter completed and I printed the script to be faxed to the listed Fax number.

## 2018-03-16 ENCOUNTER — Other Ambulatory Visit: Payer: Self-pay | Admitting: Family Medicine

## 2018-03-30 ENCOUNTER — Other Ambulatory Visit: Payer: Self-pay | Admitting: Family Medicine

## 2018-03-30 NOTE — Telephone Encounter (Signed)
Pt needs a refill on his Norvasc sent to [facility] on [address]. Pt only has one pill left and needs this refilled before the weekend. Please call [facility] once its been filled - [phone].

## 2018-04-11 ENCOUNTER — Encounter: Payer: Self-pay | Admitting: Family Medicine

## 2018-04-11 ENCOUNTER — Ambulatory Visit (INDEPENDENT_AMBULATORY_CARE_PROVIDER_SITE_OTHER): Payer: Medicare Other | Admitting: Family Medicine

## 2018-04-11 ENCOUNTER — Telehealth: Payer: Self-pay

## 2018-04-11 VITALS — BP 100/62 | HR 75 | Temp 98.0°F | Wt <= 1120 oz

## 2018-04-11 DIAGNOSIS — I10 Essential (primary) hypertension: Secondary | ICD-10-CM | POA: Diagnosis present

## 2018-04-11 DIAGNOSIS — Z23 Encounter for immunization: Secondary | ICD-10-CM | POA: Diagnosis not present

## 2018-04-11 DIAGNOSIS — F339 Major depressive disorder, recurrent, unspecified: Secondary | ICD-10-CM

## 2018-04-11 DIAGNOSIS — R7309 Other abnormal glucose: Secondary | ICD-10-CM | POA: Diagnosis not present

## 2018-04-11 DIAGNOSIS — R631 Polydipsia: Secondary | ICD-10-CM

## 2018-04-11 DIAGNOSIS — K279 Peptic ulcer, site unspecified, unspecified as acute or chronic, without hemorrhage or perforation: Secondary | ICD-10-CM | POA: Diagnosis not present

## 2018-04-11 HISTORY — DX: Polydipsia: R63.1

## 2018-04-11 LAB — POCT GLYCOSYLATED HEMOGLOBIN (HGB A1C): Hemoglobin A1C: 4.9 % (ref 4.0–5.6)

## 2018-04-11 NOTE — Telephone Encounter (Signed)
Per Dr. Gwendlyn Deutscher, pts A1C is negative for diabetes. Pt has been contacted and informed.

## 2018-04-11 NOTE — Assessment & Plan Note (Signed)
I discussed medication side effects. Most PPI has fracture risk. Ranitidine will be an alternative which has recently been recalled due to risk for cancer. He stated he is unable to d/c Prilosec for now. Monitor closely on current regimen. GI f/u recommended if having GI bleed or pain despite medication compliance.

## 2018-04-11 NOTE — Assessment & Plan Note (Signed)
Likely psychogenic. Fluid intake diary recommended for the next 1-2 weeks and return it to me. No sign of fluid overload despite G1DD on ECHO. Risk for electrolyte imbalance/hyponatremia discussed. A1C neg for DM. F/U in 1-2 weeks for reassessment. Consider lab work then.

## 2018-04-11 NOTE — Assessment & Plan Note (Signed)
BP looks good today. No change in meds.

## 2018-04-11 NOTE — Patient Instructions (Signed)
Omeprazole tablets (OTC) What is this medicine? OMEPRAZOLE (oh ME pray zol) prevents the production of acid in the stomach. It is used to treat the symptoms of heartburn. You can buy this medicine without a prescription. This product is not for long-term use, unless otherwise directed by your doctor or health care professional. This medicine may be used for other purposes; ask your health care provider or pharmacist if you have questions. COMMON BRAND NAME(S): Prilosec OTC What should I tell my health care provider before I take this medicine? They need to know if you have any of these conditions: -black or bloody stools -chest pain -difficulty swallowing -have had heartburn for over 3 months -have heartburn with dizziness, lightheadedness or sweating -liver disease -lupus -stomach pain -unexplained weight loss -vomiting with blood -wheezing -an unusual or allergic reaction to omeprazole, other medicines, foods, dyes, or preservatives -pregnant or trying to get pregnant -breast-feeding How should I use this medicine? Take this medicine by mouth. Follow the directions on the product label. If you are taking this medicine without a prescription, take one tablet every day. Do not use for longer than 14 days or repeat a course of treatment more often than every 4 months unless directed by a doctor or healthcare professional. Take your dose at regular intervals every 24 hours. Swallow the tablet whole with a drink of water. Do not crush, break or chew. This medicine works best if taken on an empty stomach 30 minutes before breakfast. If you are using this medicine with the prescription of your doctor or healthcare professional, follow the directions you were given. Do not take your medicine more often than directed. Talk to your pediatrician regarding the use of this medicine in children. Special care may be needed. Overdosage: If you think you have taken too much of this medicine contact a poison  control center or emergency room at once. NOTE: This medicine is only for you. Do not share this medicine with others. What if I miss a dose? If you miss a dose, take it as soon as you can. If it is almost time for your next dose, take only that dose. Do not take double or extra doses. What may interact with this medicine? Do not take this medicine with any of the following medications: -atazanavir -clopidogrel -nelfinavir This medicine may also interact with the following medications: -ampicillin -certain medicines for anxiety or sleep -certain medicines that treat or prevent blood clots like warfarin -cyclosporine -diazepam -digoxin -disulfiram -iron salts -methotrexate -mycophenolate mofetil -phenytoin -prescription medicine for fungal or yeast infection like itraconazole, ketoconazole, voriconazole -saquinavir -tacrolimus This list may not describe all possible interactions. Give your health care provider a list of all the medicines, herbs, non-prescription drugs, or dietary supplements you use. Also tell them if you smoke, drink alcohol, or use illegal drugs. Some items may interact with your medicine. What should I watch for while using this medicine? It can take several days before your heartburn gets better. Check with your doctor or health care professional if your condition does not start to get better, or if it gets worse. Do not treat diarrhea with over the counter products. Contact your doctor if you have diarrhea that lasts more than 2 days or if it is severe and watery. Do not treat yourself for heartburn with this medicine for more than 14 days in a row. You should only use this medicine for a 2-week treatment period once every 4 months. If your symptoms return shortly after your  therapy is complete, or within the 4 month time frame, call your doctor or health care professional. What side effects may I notice from receiving this medicine? Side effects that you should  report to your doctor or health care professional as soon as possible: -allergic reactions like skin rash, itching or hives, swelling of the face, lips, or tongue -bone, muscle or joint pain -breathing problems -chest pain or chest tightness -dark yellow or brown urine -diarrhea -dizziness -fast, irregular heartbeat -feeling faint or lightheaded -fever or sore throat -muscle spasm -palpitations -rash on cheeks or arms that gets worse in the sun -redness, blistering, peeling or loosening of the skin, including inside the mouth -seizures -tremors -unusual bleeding or bruising -unusually weak or tired -yellowing of the eyes or skin Side effects that usually do not require medical attention (report to your doctor or health care professional if they continue or are bothersome): -constipation -dry mouth -headache -loose stools -nausea This list may not describe all possible side effects. Call your doctor for medical advice about side effects. You may report side effects to FDA at 1-800-FDA-1088. Where should I keep my medicine? Keep out of the reach of children. Store at room temperature between 20 and 25 degrees C (68 and 77 degrees F). Protect from light and moisture. Throw away any unused medicine after the expiration date. NOTE: This sheet is a summary. It may not cover all possible information. If you have questions about this medicine, talk to your doctor, pharmacist, or health care provider.  2018 Elsevier/Gold Standard (2015-07-24 13:06:31)

## 2018-04-11 NOTE — Assessment & Plan Note (Signed)
No acute findings. F/U with Psych as planned.

## 2018-04-11 NOTE — Progress Notes (Signed)
Subjective:     Patient ID: Stanley [name], male   DOB: October 24, 1975, 42 y.o.   MRN: [id number]  HPI [id number] with his meds. Here for f/u. Depression:Compliant with his meds. He f/u regularly with his psychiatrist. No concern today. GERD/Ulcer: On Prilosec. He will like to know if he can switch to other option with less risk for fracture. He stated that his ulcer pain is severe without his Prilosec. Polydipsia: +polyuria ongoing for few weeks.   Current Outpatient Medications on File Prior to Visit  Medication Sig Dispense Refill  . amLODipine (NORVASC) 5 MG tablet TAKE 1 TABLET BY MOUTH EVERY DAY 90 tablet 2  . ARIPiprazole (ABILIFY) 20 MG tablet Take 15 mg by mouth daily. Prescribed by Thomasene Lot- mental health    . calcium-vitamin D (OSCAL WITH D) 500-200 MG-UNIT tablet Take 1 tablet by mouth 2 (two) times daily. 60 tablet PRN  . escitalopram (LEXAPRO) 10 MG tablet Take 1 tablet by mouth.    . ferrous sulfate 325 (65 FE) MG tablet TAKE 1 TABLET (325 MG TOTAL) BY MOUTH 2 (TWO) TIMES DAILY. (Patient taking differently: daily) 60 tablet 4  . fexofenadine (ALLEGRA) 180 MG tablet TAKE 1 TABLET BY MOUTH ONCE DAILY 31 tablet 4  . Fish Oil OIL Take 1 tablet by mouth 2 (two) times daily.     . methylphenidate (CONCERTA) 36 MG CR tablet Take 1 tablet (36 mg total) by mouth daily. Prescribed by Thomasene Lot- Mental health    . metoprolol tartrate (LOPRESSOR) 25 MG tablet TAKE 1 TABLET BY MOUTH TWICE DAILY FOR BLOOD PRESSURE 62 tablet 4  . Multiple Vitamins-Minerals (MULTIVITAMIN MEN) TABS Take 1 tablet by mouth daily. 30 tablet prn  . olopatadine (PATANOL) 0.1 % ophthalmic solution Place 1 drop into both eyes 2 (two) times daily as needed for allergies. (Patient not taking: Reported on 07/25/2017) 5 mL 2  . omeprazole (PRILOSEC) 20 MG capsule TAKE ONE CAPSULE BY MOUTH TWICE A DAY 180 capsule 3   No current facility-administered medications on file prior to visit.    Past Medical History:   Diagnosis Date  . Abnormal PFTs   . Allergic rhinitis   . Asperger's disorder   . Depression   . GERD (gastroesophageal reflux disease)   . Hiatal hernia   . Hypertension   . Iron deficiency anemia   . Osteogenesis imperfecta   . Peptic ulcer   . Psychosis ([place])   . Seborrheic dermatitis      Review of Systems  Respiratory: Negative.   Cardiovascular: Negative.   Gastrointestinal: Negative.   Genitourinary: Negative.   Neurological: Negative.   All other systems reviewed and are negative.      Objective:   Physical Exam  Constitutional: He appears well-developed. No distress.  Cardiovascular: Normal rate, regular rhythm and normal heart sounds.  No murmur heard. Pulmonary/Chest: Effort normal and breath sounds normal. No stridor. No respiratory distress. He has no wheezes.  Abdominal: Soft. Bowel sounds are normal. He exhibits no distension and no mass. There is no tenderness.  Musculoskeletal: Normal range of motion. He exhibits no edema.  Neurological: He is alert. No cranial nerve deficit.  Nursing note and vitals reviewed.    Office Visit from 04/11/2018 in [place]  PHQ-9 Total Score  1         Assessment:     HTN: Depression: PUD Polydipsia    Plan:     Check problem list.  I also recommended once  yearly Ophthalmology eval for OI.

## 2018-04-16 ENCOUNTER — Other Ambulatory Visit: Payer: Self-pay | Admitting: Family Medicine

## 2018-05-19 ENCOUNTER — Ambulatory Visit (INDEPENDENT_AMBULATORY_CARE_PROVIDER_SITE_OTHER): Payer: Medicare Other | Admitting: Family Medicine

## 2018-05-19 ENCOUNTER — Other Ambulatory Visit: Payer: Self-pay

## 2018-05-19 VITALS — BP 110/70 | HR 70 | Temp 97.7°F | Ht <= 58 in | Wt 74.0 lb

## 2018-05-19 DIAGNOSIS — J069 Acute upper respiratory infection, unspecified: Secondary | ICD-10-CM | POA: Diagnosis present

## 2018-05-19 MED ORDER — MENTHOL 5 MG MT LOZG: 1.0000 | LOZENGE | OROMUCOSAL | 0 refills | Status: DC | PRN

## 2018-05-19 MED ORDER — ACETAMINOPHEN ER 650 MG PO TBCR: 650.0000 mg | EXTENDED_RELEASE_TABLET | Freq: Three times a day (TID) | ORAL | 3 refills | Status: DC | PRN

## 2018-05-19 MED ORDER — SALINE SPRAY 0.65 % NA SOLN: 1.0000 | NASAL | 0 refills | Status: DC | PRN

## 2018-05-19 MED ORDER — ALBUTEROL SULFATE HFA 108 (90 BASE) MCG/ACT IN AERS: 2.0000 | INHALATION_SPRAY | RESPIRATORY_TRACT | 2 refills | Status: DC | PRN

## 2018-05-19 MED ORDER — IPRATROPIUM BROMIDE 0.06 % NA SOLN: 2.0000 | Freq: Four times a day (QID) | NASAL | 12 refills | Status: DC

## 2018-05-19 NOTE — Progress Notes (Deleted)
Subjective:     Stanley [name] is a 42 y.o. male who presents for evaluation of symptoms of a URI. Symptoms include congestion, cough described as nonproductive, headache described as global, shortness of breath and wheezing. Onset of symptoms was 5 days ago, and has been gradually improving since that time. Treatment to date: none.   {Common ambulatory SmartLinks:19316}  Review of Systems {ros; complete:30496}   Objective:    {exam; complete:17964}   Assessment:    {uri dx:5273::"viral upper respiratory illness"}   Plan:    {plan; uri:14054::"Discussed diagnosis and treatment of URI.","Suggested symptomatic OTC remedies.","Nasal saline spray for congestion.","Follow up as needed."}

## 2018-05-19 NOTE — Patient Instructions (Signed)
Viral Respiratory Infection A viral respiratory infection is an illness that affects parts of the body used for breathing, like the lungs, nose, and throat. It is caused by a germ called a virus. Some examples of this kind of infection are:  A cold.  The flu (influenza).  A respiratory syncytial virus (RSV) infection.  How do I know if I have this infection? Most of the time this infection causes:  A stuffy or runny nose.  Yellow or green fluid in the nose.  A cough.  Sneezing.  Tiredness (fatigue).  Achy muscles.  A sore throat.  Sweating or chills.  A fever.  A headache.  How is this infection treated? If the flu is diagnosed early, it may be treated with an antiviral medicine. This medicine shortens the length of time a person has symptoms. Symptoms may be treated with over-the-counter and prescription medicines, such as:  Expectorants. These make it easier to cough up mucus.  Decongestant nasal sprays.  Doctors do not prescribe antibiotic medicines for viral infections. They do not work with this kind of infection. How do I know if I should stay home? To keep others from getting sick, stay home if you have:  A fever.  A lasting cough.  A sore throat.  A runny nose.  Sneezing.  Muscles aches.  Headaches.  Tiredness.  Weakness.  Chills.  Sweating.  An upset stomach (nausea).  Follow these instructions at home:  Rest as much as possible.  Take over-the-counter and prescription medicines only as told by your doctor.  Drink enough fluid to keep your pee (urine) clear or pale yellow.  Gargle with salt water. Do this 3-4 times per day or as needed. To make a salt-water mixture, dissolve -1 tsp of salt in 1 cup of warm water. Make sure the salt dissolves all the way.  Use nose drops made from salt water. This helps with stuffiness (congestion). It also helps soften the skin around your nose.  Do not drink alcohol.  Do not use tobacco  products, including cigarettes, chewing tobacco, and e-cigarettes. If you need help quitting, ask your doctor. Get help if:  Your symptoms last for 10 days or longer.  Your symptoms get worse over time.  You have a fever.  You have very bad pain in your face or forehead.  Parts of your jaw or neck become very swollen. Get help right away if:  You feel pain or pressure in your chest.  You have shortness of breath.  You faint or feel like you will faint.  You keep throwing up (vomiting).  You feel confused. This information is not intended to replace advice given to you by your health care provider. Make sure you discuss any questions you have with your health care provider. Document Released: 06/03/2008 Document Revised: 11/27/2015 Document Reviewed: 11/27/2014 Elsevier Interactive Patient Education  2018 [employer].

## 2018-05-19 NOTE — Assessment & Plan Note (Signed)
Patient presented with viral URI.  Physical exam remarkable for mild wheeze throughout all lung fields.  Although no respiratory distress.  Will treat symptomatically with as needed Tylenol, ipratropium nasal spray, OTC cough drops, albuterol inhaler to use as needed.  Patient is used albuterol inhaler in the past has had what he considers good response.  Patient to follow-up if symptoms worsen.

## 2018-05-19 NOTE — Progress Notes (Signed)
Subjective:     Stanley [name] is a 42 y.o. male who presents for evaluation of symptoms of a URI. Symptoms include congestion, cough described as nonproductive, headache described as global, shortness of breath and wheezing. Onset of symptoms was 5 days ago, and has been gradually improving since that time. Treatment to date: none.  The following portions of the patient's history were reviewed and updated as appropriate: allergies, current medications, past family history, past medical history, past social history, past surgical history and problem list.  Patient endorses using albuterol inhaler in past, though has not used once a long time.  Review of Systems Review of Systems  Constitutional: Negative for chills and fever.  HENT: Positive for congestion. Negative for sinus pain.   Cardiovascular: Negative for chest pain.  All other systems reviewed and are negative.   Objective:   Vitals:   05/19/18 1139  BP: 110/70  Pulse: 70  Temp: 97.7 F (36.5 C)  SpO2: 96%   Physical Exam  Constitutional: He is oriented to person, place, and time.  dwarf stature, wheel chair bound  HENT:  Head: Atraumatic.  Nose: Rhinorrhea present.  Mouth/Throat: Oropharynx is clear and moist and mucous membranes are normal.  No tenderness to sinuses  Eyes: Right eye exhibits no discharge. Left eye exhibits no discharge.  Cardiovascular: Normal rate and regular rhythm.  Pulmonary/Chest: Effort normal. He has wheezes in the right upper field, the right middle field, the right lower field, the left upper field, the left middle field and the left lower field.  Neurological: He is alert and oriented to person, place, and time.  Skin: Skin is warm and dry.  Psychiatric: He has a normal mood and affect. His behavior is normal.    Assessment and Plan:  Viral upper respiratory tract infection Patient presented with viral URI.  Physical exam remarkable for mild wheeze throughout all lung fields.   Although no respiratory distress.  Will treat symptomatically with as needed Tylenol, ipratropium nasal spray, OTC cough drops, albuterol inhaler to use as needed.  Patient is used albuterol inhaler in the past has had what he considers good response.  Patient to follow-up if symptoms worsen.

## 2018-07-17 ENCOUNTER — Other Ambulatory Visit: Payer: Self-pay | Admitting: Family Medicine

## 2018-07-17 ENCOUNTER — Other Ambulatory Visit: Payer: Medicare Other

## 2018-07-17 DIAGNOSIS — Z79899 Other long term (current) drug therapy: Secondary | ICD-10-CM

## 2018-07-18 LAB — CBC
HEMOGLOBIN: 14.8 g/dL (ref 13.0–17.7)
Hematocrit: 44.1 % (ref 37.5–51.0)
MCH: 30.1 pg (ref 26.6–33.0)
MCHC: 33.6 g/dL (ref 31.5–35.7)
MCV: 90 fL (ref 79–97)
Platelets: 212 10*3/uL (ref 150–450)
RBC: 4.92 x10E6/uL (ref 4.14–5.80)
RDW: 12.6 % (ref 11.6–15.4)
WBC: 6.6 10*3/uL (ref 3.4–10.8)

## 2018-07-18 LAB — LIPID PANEL
Chol/HDL Ratio: 3.6 ratio (ref 0.0–5.0)
Cholesterol, Total: 176 mg/dL (ref 100–199)
HDL: 49 mg/dL (ref >39)
LDL Calculated: 117 mg/dL — ABNORMAL HIGH (ref 0–99)
Triglycerides: 48 mg/dL (ref 0–149)
VLDL Cholesterol Cal: 10 mg/dL (ref 5–40)

## 2018-07-18 LAB — COMPREHENSIVE METABOLIC PANEL
A/G RATIO: 1.4 (ref 1.2–2.2)
ALBUMIN: 4.2 g/dL (ref 3.5–5.5)
ALT: 18 IU/L (ref 0–44)
AST: 22 IU/L (ref 0–40)
Alkaline Phosphatase: 73 IU/L (ref 39–117)
BUN / CREAT RATIO: 40 — AB (ref 9–20)
BUN: 16 mg/dL (ref 6–24)
Bilirubin Total: 0.3 mg/dL (ref 0.0–1.2)
CO2: 21 mmol/L (ref 20–29)
Calcium: 9.3 mg/dL (ref 8.7–10.2)
Chloride: 102 mmol/L (ref 96–106)
Creatinine, Ser: 0.4 mg/dL — ABNORMAL LOW (ref 0.76–1.27)
GFR calc Af Amer: 169 mL/min/{1.73_m2} (ref >59)
GFR calc non Af Amer: 146 mL/min/{1.73_m2} (ref >59)
Globulin, Total: 3 g/dL (ref 1.5–4.5)
Glucose: 174 mg/dL — ABNORMAL HIGH (ref 65–99)
Potassium: 4.5 mmol/L (ref 3.5–5.2)
Sodium: 139 mmol/L (ref 134–144)
Total Protein: 7.2 g/dL (ref 6.0–8.5)

## 2018-07-21 ENCOUNTER — Other Ambulatory Visit: Payer: Self-pay | Admitting: Family Medicine

## 2018-08-10 ENCOUNTER — Other Ambulatory Visit: Payer: Self-pay

## 2018-08-10 MED ORDER — FEXOFENADINE HCL 180 MG PO TABS: 180.0000 mg | ORAL_TABLET | Freq: Every day | ORAL | 1 refills | Status: DC

## 2018-08-28 ENCOUNTER — Ambulatory Visit (INDEPENDENT_AMBULATORY_CARE_PROVIDER_SITE_OTHER): Payer: Medicare Other | Admitting: Family Medicine

## 2018-08-28 DIAGNOSIS — Z6833 Body mass index (BMI) 33.0-33.9, adult: Secondary | ICD-10-CM

## 2018-08-28 DIAGNOSIS — E669 Obesity, unspecified: Secondary | ICD-10-CM

## 2018-08-28 NOTE — Progress Notes (Signed)
Medical Nutrition Therapy Care giver Stanley [name]  Assessment:  Primary concerns today: Weight management.  Stanley [name] has been working on weight loss for the past 3 months.  His caregiver Stanley [name] cooks all his meals for him.  They eat takeout foods occasionally (<1 X wk), and Stanley [name] also occasionally gets a coffee drink from [employer] or Starbucks.  We looked at the nutritional content of such drinks, and discovered one [employer] large frappe provides 670 kcal.    Stanley [name] seems genuinely receptive to the slight changes we discussed today in meal planning and prep for [facility].  He admitted that he does feel better, the closer his weight gets to 60 lb, and we all agreed that is a good goal weight for him.  Also discussed not making any foods completely off-limits, but to experiment with ways of preparing foods (and homemade coffee drinks) that are satisfying as well as nourishing.    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  He has access to a microwave Tue-Fri.    Learning Readiness: Change in progress  Usual eating pattern: 3 meals and 0-1 snack per day. Frequent foods and beverages: water, coffee with sk milk/creamer, flavored water, diet soda, chx, fish, hotdogs, taco salads, hamburger, sandwiches.   Usual physical activity: Limited d/t osteogenesis imperfecta (wheelchair-bound).    No 24-hr recall completed today, but reviewed 1-week food record Stanley [name] had kept.  Intake included veg's most days, and most meals were nutritionally balanced.  Breakfast is usually instant oatmeal with raisins.  Stanley [name] asked about sugar-free cookies Stanley [name] sometimes eats as a treat.  I cautioned about these, which need to be evaluated individually for nutritional content (e.g., sugar-free does not mean carb-free or low-kcal).     Nutritional Diagnosis:  [place]-3.3 Overweight/obesity As related to energy imbalance.  As evidenced by BMI >33.  Handouts given during visit  include:  After-Visit Summary (AVS)  Demonstrated degree of understanding via:  Teach Back  Barriers to learning/adherence to lifestyle change: Stanley [name] has a history of weight fluctuation, and weight management is made more challenging b/c of his limited ability for physical activity.    Monitoring/Evaluation:  Dietary intake, exercise, and body weight weight check in 2 weeks; MNT appt in 5 weeks.

## 2018-08-28 NOTE — Patient Instructions (Signed)
-   Any time you use a frozen dinner, ADD some vegetables.  - Suggestions:  Use plain non-fat Greek yogurt in place of part of the mayo in recipes, or as a substitute for sour cream.    Oatmeal:  When possible, plain, cooked oatmeal is best.    Coffee drink: Experiment with making your own:  Try 1 c nonfat milk with protein powder (use ~1/3 serving size)  [[employer] carries Orgain pro powder.]  Studies suggest that meals containing at least 3 separate components are most satisfying.    Goals: 1. At least 1 cup of vegetables at both lunch and dinner.

## 2018-09-13 ENCOUNTER — Encounter: Payer: Self-pay | Admitting: Family Medicine

## 2018-09-14 ENCOUNTER — Encounter: Payer: Self-pay | Admitting: Family Medicine

## 2018-09-14 ENCOUNTER — Other Ambulatory Visit: Payer: Self-pay

## 2018-09-14 ENCOUNTER — Ambulatory Visit (INDEPENDENT_AMBULATORY_CARE_PROVIDER_SITE_OTHER): Payer: Medicare Other | Admitting: Family Medicine

## 2018-09-14 DIAGNOSIS — Q780 Osteogenesis imperfecta: Secondary | ICD-10-CM | POA: Diagnosis present

## 2018-09-14 DIAGNOSIS — F29 Unspecified psychosis not due to a substance or known physiological condition: Secondary | ICD-10-CM | POA: Diagnosis not present

## 2018-09-14 DIAGNOSIS — F339 Major depressive disorder, recurrent, unspecified: Secondary | ICD-10-CM | POA: Diagnosis not present

## 2018-09-14 DIAGNOSIS — R631 Polydipsia: Secondary | ICD-10-CM

## 2018-09-14 DIAGNOSIS — I781 Nevus, non-neoplastic: Secondary | ICD-10-CM

## 2018-09-14 DIAGNOSIS — D1801 Hemangioma of skin and subcutaneous tissue: Secondary | ICD-10-CM

## 2018-09-14 NOTE — Patient Instructions (Signed)
It was nice seeing you today. Please check with Dr. Lorin Mercy regarding your Fosamax. I will see you back in 4-6 months.

## 2018-09-14 NOTE — Progress Notes (Signed)
Subjective:     Patient ID: Stanley [name], male   DOB: [birth year], 43 y.o.   MRN: [id number]  HPI [id number] any concern. He is here for f/u. His guidian and mother requested a letter of medical necessity for a standing bathing chair and also a copy of his recent lab result. Also asked about protection against Covi-19. Rash: C/O tiny bumps on his scalp x 1 month.His guidance thought this has spread some. No itching.Patient did not know he has bumps on his head. Polydipsia: He still drinks a lot of water and they will like to know how much is too much for him.  Current Outpatient Medications on File Prior to Visit  Medication Sig Dispense Refill  . amLODipine (NORVASC) 5 MG tablet TAKE 1 TABLET BY MOUTH EVERY DAY 90 tablet 2  . ARIPiprazole (ABILIFY) 20 MG tablet Take 15 mg by mouth daily. Prescribed by Thomasene Lot- mental health    . calcium-vitamin D (OSCAL WITH D) 500-200 MG-UNIT tablet Take 1 tablet by mouth 2 (two) times daily. 60 tablet PRN  . escitalopram (LEXAPRO) 10 MG tablet Take 1 tablet by mouth.    . ferrous sulfate 325 (65 FE) MG tablet TAKE 1 TABLET (325 MG TOTAL) BY MOUTH 2 (TWO) TIMES DAILY. (Patient taking differently: daily) 60 tablet 4  . fexofenadine (ALLEGRA) 180 MG tablet Take 1 tablet (180 mg total) by mouth daily. 90 tablet 1  . Fish Oil OIL Take 1 tablet by mouth 2 (two) times daily.     . methylphenidate (CONCERTA) 36 MG CR tablet Take 1 tablet (36 mg total) by mouth daily. Prescribed by Thomasene Lot- Mental health    . metoprolol tartrate (LOPRESSOR) 25 MG tablet TAKE 1 TABLET BY MOUTH TWICE DAILY FOR BLOOD PRESSURE 62 tablet 4  . Multiple Vitamins-Minerals (MULTIVITAMIN MEN) TABS Take 1 tablet by mouth daily. 30 tablet prn  . olopatadine (PATANOL) 0.1 % ophthalmic solution Place 1 drop into both eyes 2 (two) times daily as needed for allergies. 5 mL 2  . omeprazole (PRILOSEC) 20 MG capsule TAKE 1 CAPSULE BY MOUTH TWICE A DAY 180 capsule 1  . sodium chloride (OCEAN)  0.65 % SOLN nasal spray Place 1 spray into both nostrils as needed for congestion. 1 Bottle 0  . acetaminophen (TYLENOL 8 HOUR) 650 MG CR tablet Take 1 tablet (650 mg total) by mouth every 8 (eight) hours as needed for pain. (Patient not taking: Reported on 09/14/2018) 30 tablet 3  . albuterol (PROVENTIL HFA;VENTOLIN HFA) 108 (90 Base) MCG/ACT inhaler Inhale 2-4 puffs into the lungs every 4 (four) hours as needed for wheezing (or cough). (Patient not taking: Reported on 08/28/2018) 2 Inhaler 2  . Menthol (COUGH DROPS) 5 MG LOZG Use as directed 1 lozenge (5 mg total) in the mouth or throat as needed. (Patient not taking: Reported on 08/28/2018) 25 lozenge 0   No current facility-administered medications on file prior to visit.    Past Medical History:  Diagnosis Date  . Abnormal PFTs   . Allergic rhinitis   . Asperger's disorder   . Depression   . Fecal urgency 05/14/2010   Evaluated by Dr. Fuller Plan, GI who recommends referral to [facility]. (02/24/11)    . Fracture 01/12/2012   Multiple fractures after fall from wheelchair 12/2011   . GERD (gastroesophageal reflux disease)   . Hiatal hernia   . Hypertension   . Iron deficiency anemia   . Osteogenesis imperfecta   . Other urinary incontinence 07/03/2007  Qualifier: Diagnosis of  By: Jamelle Haring, Jeannie    . Peptic ulcer   . Psychosis ([place])   . Seborrheic dermatitis    Vitals:   09/14/18 1613  BP: 110/64  Weight: 66 lb 9.6 oz (30.2 kg)     Review of Systems  Respiratory: Negative.   Cardiovascular: Negative.   Gastrointestinal: Negative.   Genitourinary: Negative.   Musculoskeletal: Negative.   Skin:       Bumps on scalp  Neurological: Negative.   All other systems reviewed and are negative.      Objective:   Physical Exam Vitals signs and nursing note reviewed.  Constitutional:      Appearance: He is not ill-appearing.     Comments: Small stature  Cardiovascular:     Rate and Rhythm: Normal rate and regular rhythm.     Pulses:  Normal pulses.     Heart sounds: Normal heart sounds. No murmur.  Pulmonary:     Effort: Pulmonary effort is normal. No respiratory distress.     Breath sounds: Normal breath sounds. No wheezing or rhonchi.  Abdominal:     General: Bowel sounds are normal.     Palpations: Abdomen is soft. There is no mass.     Tenderness: There is no abdominal tenderness.  Musculoskeletal:        General: Deformity present. No swelling, tenderness or signs of injury.     Right lower leg: No edema.     Left lower leg: No edema.     Comments: Limb deformity. Short limbs  Skin:    Comments: Two tiny, less than 0.52m red,soft papules on his scalps  Neurological:     Mental Status: He is alert.        Assessment:     Osteogenesis Imperfecta Cherry hemangiomas Polydypsia    Plan:     Check problem list.

## 2018-09-15 ENCOUNTER — Encounter: Payer: Self-pay | Admitting: Family Medicine

## 2018-09-15 DIAGNOSIS — I781 Nevus, non-neoplastic: Secondary | ICD-10-CM

## 2018-09-15 DIAGNOSIS — D1801 Hemangioma of skin and subcutaneous tissue: Secondary | ICD-10-CM | POA: Insufficient documentation

## 2018-09-15 NOTE — Assessment & Plan Note (Signed)
Condition stable. I discussed Biphosphonate in the setting of OI and osteopenia on Dexa scan. Per the patient and his mother, he had been on this in the past. However, his orthopedic/Dr. Lorin Mercy advised him to d/c meds due to poor bone remodeling. Not clear what this mean. I advised patient to follow-up with Dr. Lorin Mercy soon to discuss. His mother also stated that she will check with the association of OI. Continue Oscal D. OTC medication form completed. Medication reconciliation completed during this visit. I will complete letter of medical necessity and I also placed order for a standing bath chair on Epic to [facility]. Letter will be faxed to [facility] @ [phone].

## 2018-09-15 NOTE — Assessment & Plan Note (Signed)
No acute change. A copy of his lab work printed for him psychiatry. Continue management per Psych.

## 2018-09-15 NOTE — Assessment & Plan Note (Signed)
Patient reassured this is benign. We will monitor closely for any change.

## 2018-09-15 NOTE — Assessment & Plan Note (Signed)
A copy of his lab work printed for him psychiatry. Continue management per Psych.

## 2018-09-15 NOTE — Assessment & Plan Note (Signed)
He drinks less than 1 L per day. I discussed with Dr. Jenne Campus, she recommended approximately 1.5 L per day for him calculated by weight. Patient drinks way less than that. Recent sodium level was normal. I offered to recheck but patient declined at this time. She is scheduled to see Dr. Jenne Campus soon who will review his water intake with him. Patient and parent verbalized understanding.

## 2018-09-28 ENCOUNTER — Other Ambulatory Visit: Payer: Self-pay | Admitting: Family Medicine

## 2018-09-28 ENCOUNTER — Telehealth: Payer: Self-pay | Admitting: Family Medicine

## 2018-09-28 DIAGNOSIS — Q780 Osteogenesis imperfecta: Secondary | ICD-10-CM

## 2018-09-28 NOTE — Progress Notes (Signed)
Patient Calls   Fabio Neighbors, Amy P routed conversation to You 1 hour ago (11:33 AM)    Fabio Neighbors, Amy P 1 hour ago (11:33 AM)      Pt home health aid called and said the pt needs an order sent to [facility] for gloves and wipes. She says they need these as soon as possible because he is about out. Kimberly's call back is [phone] if you have any questions.      Documentation     [employer] [phone]  Fabio Neighbors, Amy P 1 hour ago (11:31 AM)

## 2018-09-28 NOTE — Telephone Encounter (Signed)
Spoke with Joelene Millin, relayed the message that Dr. Gwendlyn Deutscher left. I also informed that a DME was placed, and to try to get the items from [facility]. Pts caregiver understood. Salvatore Marvel, CMA

## 2018-09-28 NOTE — Telephone Encounter (Signed)
Please call patient that [facility] said their insurance might not cover. They can go to one of the [facility] and get it. In the mean time, I have placed the DME order to [facility].

## 2018-09-28 NOTE — Telephone Encounter (Signed)
Pt home health aid called and said the pt needs an order sent to [facility] for gloves and wipes. She says they need these as soon as possible because he is about out. Kimberly's call back is [phone] if you have any questions.

## 2018-09-28 NOTE — Progress Notes (Signed)
Patient ID: Stanley [name], male   DOB: [birth year], 43 y.o.   MRN: [id number] I spoke with Joelene Millin and she stated that patient needs medium to large size hand gloves and Adult size bowel wipes.  She also mentioned that the bathing stool received from [facility] is not what they wanted and they will need to return it. They already got his stool from a different company. Joelene Millin will contact [facility] to return the stool.  I called [facility] and spoke with Darlina Guys, she stated that insurance might not cover gloves and wipes and recommended that they go to the store to get it. I message staff to call patient with this information. In the mean time, I will place a DME order per Melissa's recommendation.

## 2018-10-02 ENCOUNTER — Other Ambulatory Visit: Payer: Self-pay | Admitting: Family Medicine

## 2018-10-02 NOTE — Telephone Encounter (Signed)
Pt's home health aide calling requesting this refill on omeprazole. Please advise

## 2018-10-03 ENCOUNTER — Ambulatory Visit: Payer: Medicare Other | Admitting: Family Medicine

## 2018-10-09 ENCOUNTER — Ambulatory Visit: Payer: Medicare Other | Admitting: Family Medicine

## 2018-12-06 ENCOUNTER — Other Ambulatory Visit: Payer: Self-pay | Admitting: Family Medicine

## 2018-12-28 ENCOUNTER — Other Ambulatory Visit: Payer: Self-pay | Admitting: Family Medicine

## 2019-02-02 ENCOUNTER — Other Ambulatory Visit: Payer: Self-pay | Admitting: Family Medicine

## 2019-02-02 ENCOUNTER — Telehealth: Payer: Self-pay | Admitting: *Deleted

## 2019-02-02 MED ORDER — FEXOFENADINE HCL 180 MG PO TABS: 180.0000 mg | ORAL_TABLET | Freq: Every day | ORAL | 1 refills | Status: DC

## 2019-02-02 MED ORDER — METOPROLOL TARTRATE 25 MG PO TABS: ORAL_TABLET | ORAL | 1 refills | Status: DC

## 2019-02-02 MED ORDER — OMEPRAZOLE 20 MG PO CPDR: 20.0000 mg | DELAYED_RELEASE_CAPSULE | Freq: Two times a day (BID) | ORAL | 1 refills | Status: DC

## 2019-02-02 NOTE — Telephone Encounter (Signed)
Wanted to confirm his pharmacy. [facility]. Please advise him that [facility]rib[facility] to [facility]. Thanks.

## 2019-02-02 NOTE — Telephone Encounter (Signed)
Pt states that he missed a call from [place].  I see nothing in chart, will check with MD. Christen Bame, CMA

## 2019-02-02 NOTE — Telephone Encounter (Signed)
Patient informed.  [name],CMA

## 2019-04-12 ENCOUNTER — Ambulatory Visit (INDEPENDENT_AMBULATORY_CARE_PROVIDER_SITE_OTHER): Payer: Medicare Other

## 2019-04-12 DIAGNOSIS — Z23 Encounter for immunization: Secondary | ICD-10-CM

## 2019-04-12 NOTE — Progress Notes (Signed)
Pt came to office for flu shot. Flu shot given to pt in his car in the RD. Pt tolerated well. Pt requested a Pneumonia vaccine. After speaking with Dr. Owens Shark the decision was that pt does not need a pneumonia vaccine at this time. Ottis Stain, CMA

## 2019-04-18 ENCOUNTER — Other Ambulatory Visit: Payer: Self-pay

## 2019-04-18 DIAGNOSIS — Z20822 Contact with and (suspected) exposure to covid-19: Secondary | ICD-10-CM

## 2019-04-19 LAB — NOVEL CORONAVIRUS, NAA: SARS-CoV-2, NAA: NOT DETECTED

## 2019-06-14 ENCOUNTER — Other Ambulatory Visit: Payer: Self-pay | Admitting: Family Medicine

## 2019-08-29 ENCOUNTER — Other Ambulatory Visit: Payer: Self-pay | Admitting: Family Medicine

## 2019-09-02 ENCOUNTER — Ambulatory Visit: Payer: Medicare Other | Attending: Internal Medicine

## 2019-09-02 DIAGNOSIS — Z23 Encounter for immunization: Secondary | ICD-10-CM | POA: Insufficient documentation

## 2019-09-02 NOTE — Progress Notes (Signed)
   Covid-19 Vaccination Clinic  Name:  Stanley [name]    MRN: [id number] DOB: 08/08/1975  09/02/2019  Stanley [name] was observed post Covid-19 immunization for 15 minutes without incidence. He was provided with Vaccine Information Sheet and instruction to access the V-Safe system.   Stanley [name] was instructed to call 911 with any severe reactions post vaccine: Marland Kitchen Difficulty breathing  . Swelling of your face and throat  . A fast heartbeat  . A bad rash all over your body  . Dizziness and weakness    Immunizations Administered    Name Date Dose VIS Date Route   Pfizer COVID-19 Vaccine 09/02/2019  3:19 PM 0.3 mL 06/15/2019 Intramuscular   Manufacturer: [place]   Lot: [id number]   [place]: [id number]

## 2019-09-17 ENCOUNTER — Other Ambulatory Visit: Payer: Medicare Other

## 2019-09-18 ENCOUNTER — Other Ambulatory Visit: Payer: Medicare Other

## 2019-09-19 ENCOUNTER — Telehealth: Payer: Self-pay | Admitting: Family Medicine

## 2019-09-19 NOTE — Telephone Encounter (Signed)
[facility] form dropped in my inbox with no clear information regarding it.  I called all number listed and was able to leave a HIPAA compliant callback message for him.   I suspect lab was requested by his Psy doctor. I will await his callback to know if he is aware of the form and will need to come in for a lab work.

## 2019-10-03 ENCOUNTER — Ambulatory Visit: Payer: Medicare Other | Attending: Internal Medicine

## 2019-10-03 DIAGNOSIS — Z23 Encounter for immunization: Secondary | ICD-10-CM

## 2019-10-03 NOTE — Progress Notes (Signed)
   Covid-19 Vaccination Clinic  Name:  Ivor Kishi    MRN: [id number] DOB: January 05, 1976  10/03/2019  Mr. Ives-Rublee was observed post Covid-19 immunization for 15 minutes without incident. He was provided with Vaccine Information Sheet and instruction to access the V-Safe system.   Mr. Leopard was instructed to call 911 with any severe reactions post vaccine: Marland Kitchen Difficulty breathing  . Swelling of face and throat  . A fast heartbeat  . A bad rash all over body  . Dizziness and weakness   Immunizations Administered    Name Date Dose VIS Date Route   Pfizer COVID-19 Vaccine 10/03/2019  3:13 PM 0.3 mL 06/15/2019 Intramuscular   Manufacturer: [employer], [employer]   Lot: [id number]   [place]: [address]-2

## 2019-10-08 ENCOUNTER — Encounter: Payer: Self-pay | Admitting: Family Medicine

## 2019-10-09 ENCOUNTER — Ambulatory Visit: Payer: Medicare Other | Admitting: Family Medicine

## 2019-10-10 ENCOUNTER — Other Ambulatory Visit: Payer: Self-pay | Admitting: Family Medicine

## 2019-10-10 ENCOUNTER — Encounter: Payer: Self-pay | Admitting: Family Medicine

## 2019-10-10 NOTE — Progress Notes (Signed)
Certified NO-show letter mailed.

## 2019-10-23 ENCOUNTER — Ambulatory Visit (INDEPENDENT_AMBULATORY_CARE_PROVIDER_SITE_OTHER): Payer: Medicare Other | Admitting: Family Medicine

## 2019-10-23 ENCOUNTER — Encounter: Payer: Self-pay | Admitting: Family Medicine

## 2019-10-23 ENCOUNTER — Other Ambulatory Visit: Payer: Self-pay

## 2019-10-23 ENCOUNTER — Ambulatory Visit: Payer: Medicare Other | Admitting: Family Medicine

## 2019-10-23 VITALS — BP 118/64 | HR 90 | Wt <= 1120 oz

## 2019-10-23 DIAGNOSIS — I10 Essential (primary) hypertension: Secondary | ICD-10-CM | POA: Diagnosis present

## 2019-10-23 DIAGNOSIS — R7309 Other abnormal glucose: Secondary | ICD-10-CM | POA: Diagnosis not present

## 2019-10-23 DIAGNOSIS — F29 Unspecified psychosis not due to a substance or known physiological condition: Secondary | ICD-10-CM | POA: Diagnosis not present

## 2019-10-23 DIAGNOSIS — E785 Hyperlipidemia, unspecified: Secondary | ICD-10-CM | POA: Diagnosis not present

## 2019-10-23 DIAGNOSIS — F339 Major depressive disorder, recurrent, unspecified: Secondary | ICD-10-CM | POA: Diagnosis not present

## 2019-10-23 DIAGNOSIS — Q780 Osteogenesis imperfecta: Secondary | ICD-10-CM | POA: Diagnosis not present

## 2019-10-23 LAB — POCT GLYCOSYLATED HEMOGLOBIN (HGB A1C): Hemoglobin A1C: 5.1 % (ref 4.0–5.6)

## 2019-10-23 NOTE — Assessment & Plan Note (Signed)
FLP checked today. I will contact him with the result.

## 2019-10-23 NOTE — Assessment & Plan Note (Signed)
Stable on current regimen. Lab ordered: Cmet I will call with result.

## 2019-10-23 NOTE — Assessment & Plan Note (Signed)
He is compliant with meds and Psych follow-up. I reviewed her Psych lab request order and placed order for CMET, FLP, TSH and A1C. He will return to pick up lab report for his Psychiatrist.

## 2019-10-23 NOTE — Assessment & Plan Note (Signed)
No acute flare. Not suicidal or homicidal. He is compliant with meds and Psych follow-up. I reviewed her Psych lab request order and placed order for CMET, FLP, TSH and A1C. He will return to pick up lab report for his Psychiatrist.

## 2019-10-23 NOTE — Patient Instructions (Signed)

## 2019-10-23 NOTE — Assessment & Plan Note (Signed)
No acute findings. Continue supplements.

## 2019-10-23 NOTE — Progress Notes (Signed)
    SUBJECTIVE:   CHIEF COMPLAINT / HPI:   HTH/HLD: He is compliant with Norvasc 5 mg QD and metoprolol 25 mg BID. No concerns today. Elevated LDL, not on meds.  OI: Denies bone pain. Compliant with Vitamin D and Calcium supplement.  Mental health issues: Here for labs per his psychiatrist.  PERTINENT  PMH / [place]:  PMX reviewed  OBJECTIVE:   BP 118/64   Pulse 90   Wt 57 lb 3.2 oz (25.9 kg)   SpO2 100%   BMI 29.38 kg/m    Physical Exam Vitals reviewed.  Cardiovascular:     Rate and Rhythm: Normal rate and regular rhythm.     Heart sounds: Normal heart sounds. No murmur.  Pulmonary:     Effort: Pulmonary effort is normal. No respiratory distress.     Breath sounds: Normal breath sounds. No wheezing.  Abdominal:     General: Bowel sounds are normal.     Palpations: Abdomen is soft. There is no mass.     Tenderness: There is no abdominal tenderness.  Musculoskeletal:     Right lower leg: No edema.     Left lower leg: No edema.  Psychiatric:        Mood and Affect: Mood normal.        Behavior: Behavior normal.        Thought Content: Thought content normal.        Judgment: Judgment normal.      ASSESSMENT/PLAN:   HYPERTENSION, BENIGN SYSTEMIC Stable on current regimen. Lab ordered: Cmet I will call with result.  Hyperlipidemia FLP checked today. I will contact him with the result.  OSTEOGENESIS IMPERFECTA, CONGENITAL No acute findings. Continue supplements.  Major depressive disorder, recurrent episode He is compliant with meds and Psych follow-up. I reviewed her Psych lab request order and placed order for CMET, FLP, TSH and A1C. He will return to pick up lab report for his Psychiatrist.  Psychosis ([place]) No acute flare. Not suicidal or homicidal. He is compliant with meds and Psych follow-up. I reviewed her Psych lab request order and placed order for CMET, FLP, TSH and A1C. He will return to pick up lab report for his [place], [place]

## 2019-10-24 ENCOUNTER — Telehealth: Payer: Self-pay | Admitting: Family Medicine

## 2019-10-24 LAB — CBC WITH DIFFERENTIAL/PLATELET
Basophils Absolute: 0.1 10*3/uL (ref 0.0–0.2)
Basos: 1 %
EOS (ABSOLUTE): 0.6 10*3/uL — ABNORMAL HIGH (ref 0.0–0.4)
Eos: 10 %
Hematocrit: 45.1 % (ref 37.5–51.0)
Hemoglobin: 14.8 g/dL (ref 13.0–17.7)
Immature Grans (Abs): 0 10*3/uL (ref 0.0–0.1)
Immature Granulocytes: 0 %
Lymphocytes Absolute: 1.8 10*3/uL (ref 0.7–3.1)
Lymphs: 31 %
MCH: 29.6 pg (ref 26.6–33.0)
MCHC: 32.8 g/dL (ref 31.5–35.7)
MCV: 90 fL (ref 79–97)
Monocytes Absolute: 0.5 10*3/uL (ref 0.1–0.9)
Monocytes: 9 %
Neutrophils Absolute: 3 10*3/uL (ref 1.4–7.0)
Neutrophils: 49 %
Platelets: 183 10*3/uL (ref 150–450)
RBC: 5 x10E6/uL (ref 4.14–5.80)
RDW: 12.8 % (ref 11.6–15.4)
WBC: 5.9 10*3/uL (ref 3.4–10.8)

## 2019-10-24 LAB — CMP14+EGFR
ALT: 18 IU/L (ref 0–44)
AST: 22 IU/L (ref 0–40)
Albumin/Globulin Ratio: 1.6 (ref 1.2–2.2)
Albumin: 4.2 g/dL (ref 4.0–5.0)
Alkaline Phosphatase: 79 IU/L (ref 39–117)
BUN/Creatinine Ratio: 33 — ABNORMAL HIGH (ref 9–20)
BUN: 13 mg/dL (ref 6–24)
Bilirubin Total: 0.4 mg/dL (ref 0.0–1.2)
CO2: 23 mmol/L (ref 20–29)
Calcium: 9.3 mg/dL (ref 8.7–10.2)
Chloride: 103 mmol/L (ref 96–106)
Creatinine, Ser: 0.4 mg/dL — ABNORMAL LOW (ref 0.76–1.27)
GFR calc Af Amer: 168 mL/min/{1.73_m2} (ref >59)
GFR calc non Af Amer: 145 mL/min/{1.73_m2} (ref >59)
Globulin, Total: 2.6 g/dL (ref 1.5–4.5)
Glucose: 71 mg/dL (ref 65–99)
Potassium: 4.3 mmol/L (ref 3.5–5.2)
Sodium: 143 mmol/L (ref 134–144)
Total Protein: 6.8 g/dL (ref 6.0–8.5)

## 2019-10-24 LAB — LIPID PANEL
Chol/HDL Ratio: 4.1 ratio (ref 0.0–5.0)
Cholesterol, Total: 193 mg/dL (ref 100–199)
HDL: 47 mg/dL (ref >39)
LDL Chol Calc (NIH): 132 mg/dL — ABNORMAL HIGH (ref 0–99)
Triglycerides: 77 mg/dL (ref 0–149)
VLDL Cholesterol Cal: 14 mg/dL (ref 5–40)

## 2019-10-24 LAB — TSH: TSH: 2.02 u[IU]/mL (ref 0.450–4.500)

## 2019-10-24 NOTE — Telephone Encounter (Signed)
Patient's caregiver returns call to nurse line and informed of below.   Talbot Grumbling, RN

## 2019-10-24 NOTE — Telephone Encounter (Signed)
HIPAA compliant callback message left.   Note:If he calls, please relay message.   Result is significant for elevated LDL. Lifestyle modification indicated.  He can come in to pick up a copy if his result.

## 2019-11-06 ENCOUNTER — Ambulatory Visit: Payer: Medicare Other | Admitting: Family Medicine

## 2019-12-17 ENCOUNTER — Telehealth: Payer: Self-pay

## 2019-12-17 NOTE — Telephone Encounter (Signed)
Stanley [name], patients caregiver, calls nurse line regarding Oscal alternative. Stanley [name] reports that [name] is being discontinued and they need a different alternative to be sent into [facility].   Forwarding to PCP  Talbot Grumbling, RN

## 2019-12-18 ENCOUNTER — Other Ambulatory Visit: Payer: Self-pay | Admitting: Family Medicine

## 2019-12-18 MED ORDER — FISH OIL 1000 MG PO CAPS: 1000.0000 mg | ORAL_CAPSULE | Freq: Two times a day (BID) | ORAL | 99 refills | Status: DC

## 2019-12-18 MED ORDER — OSCAL 500/200 D-3 500-200 MG-UNIT PO TABS: 1.0000 | ORAL_TABLET | Freq: Two times a day (BID) | ORAL | 6 refills | Status: DC

## 2019-12-18 NOTE — Telephone Encounter (Signed)
I called and spoke with his pharmacy to clarify this request. OscalD has not been discontinued and they have this in stock. He need refill of OscalD and Fish oil.  I called [name] and she said she had not been able to find the medication OTC at any pharmacy, hence she thought it has been d/ced. I advised her that I have escribed this meds and can be picked up at [facility], although it is OTC. She agreed with the plan.

## 2019-12-18 NOTE — Telephone Encounter (Signed)
Please advise patient that I have escribed Calcium Carbonate and Vitamin D to the pharmacy. OscalD is a combination of both, hence we can split into two.  D/C OscalD altogether while on Calcium and Vitamin D. (Note that both are OTC meds, but will escribe)

## 2020-01-09 ENCOUNTER — Other Ambulatory Visit: Payer: Self-pay | Admitting: Family Medicine

## 2020-01-10 ENCOUNTER — Other Ambulatory Visit: Payer: Self-pay | Admitting: Family Medicine

## 2020-01-19 ENCOUNTER — Other Ambulatory Visit: Payer: Self-pay | Admitting: Family Medicine

## 2020-01-23 ENCOUNTER — Other Ambulatory Visit: Payer: Self-pay | Admitting: Family Medicine

## 2020-02-04 ENCOUNTER — Other Ambulatory Visit: Payer: Self-pay | Admitting: Family Medicine

## 2020-03-21 ENCOUNTER — Other Ambulatory Visit: Payer: Medicare Other

## 2020-03-25 ENCOUNTER — Other Ambulatory Visit: Payer: Medicare Other

## 2020-03-25 DIAGNOSIS — Z20822 Contact with and (suspected) exposure to covid-19: Secondary | ICD-10-CM

## 2020-03-27 LAB — NOVEL CORONAVIRUS, NAA: SARS-CoV-2, NAA: NOT DETECTED

## 2020-03-27 LAB — SARS-COV-2, NAA 2 DAY TAT

## 2020-05-08 ENCOUNTER — Encounter: Payer: Self-pay | Admitting: Family Medicine

## 2020-05-09 ENCOUNTER — Ambulatory Visit (INDEPENDENT_AMBULATORY_CARE_PROVIDER_SITE_OTHER): Payer: Medicare Other | Admitting: Family Medicine

## 2020-05-09 ENCOUNTER — Other Ambulatory Visit: Payer: Self-pay

## 2020-05-09 ENCOUNTER — Encounter: Payer: Self-pay | Admitting: Family Medicine

## 2020-05-09 VITALS — BP 108/70 | HR 77 | Ht <= 58 in | Wt <= 1120 oz

## 2020-05-09 DIAGNOSIS — I10 Essential (primary) hypertension: Secondary | ICD-10-CM

## 2020-05-09 DIAGNOSIS — Z1159 Encounter for screening for other viral diseases: Secondary | ICD-10-CM | POA: Diagnosis not present

## 2020-05-09 DIAGNOSIS — Q780 Osteogenesis imperfecta: Secondary | ICD-10-CM | POA: Diagnosis not present

## 2020-05-09 DIAGNOSIS — Z23 Encounter for immunization: Secondary | ICD-10-CM | POA: Diagnosis present

## 2020-05-09 DIAGNOSIS — E785 Hyperlipidemia, unspecified: Secondary | ICD-10-CM

## 2020-05-09 DIAGNOSIS — K1379 Other lesions of oral mucosa: Secondary | ICD-10-CM

## 2020-05-09 NOTE — Assessment & Plan Note (Signed)
BP looks good on his current regimen.

## 2020-05-09 NOTE — Assessment & Plan Note (Signed)
Previous lab result reviewed and was discussed with him and his mother who was present for today's visit. Copy of lab report was printed for his mother at her request. Mom has his 89. Repeat FLP in April 2022.

## 2020-05-09 NOTE — Assessment & Plan Note (Signed)
No acute change. We discussed his diet and weight monitoring. I will check in with Dr. Jenne Campus if he needs follow-up appointment soon for weight management.

## 2020-05-09 NOTE — Patient Instructions (Signed)
s Hepatitis C Testing Why am I having this test? Hepatitis C testing is done to check for a liver infection caused by the hepatitis C virus (HCV). You may have one or more hepatitis C tests done:  To help your health care provider diagnose HCV infection, if you have possible signs or symptoms of infection.  To check for infection, if you may have been exposed to HCV.  To find the cause of long-term (chronic) liver disease or abnormal liver function test results.  To see if you have had hepatitis C in the past. Hepatitis C is usually diagnosed with three blood tests:  Anti-HCV test, also called the HCV antibody test.  HCV RNA test.  HCV genotype test. If you are diagnosed with a current (active) HCV infection, you may have another test done to help monitor your condition during treatment. This test is called the quantitative HCV RNA test. What is being tested? Each HCV test measures the amounts of different substances in your blood.  The anti-HCV test checks for proteins that your body makes to fight HCV (antibodies). If you have antibodies to HCV, it means you have been infected with hepatitis C. It does not necessarily mean that you have an active infection.  The HCV RNA test checks for genetic material from HCV. This test is done if your HCV antibody test is positive and your health care provider wants to find out if you have an active infection.  The HCV genotype test. This test identifies the type (genotype) of virus you have.  The quantitative HCV RNA test measures the amount of virus in your blood (viral load). What kind of sample is taken?  A blood sample is required for HCV tests. It is usually collected by inserting a needle into a blood vessel. How are the results reported?  Anti-HCV test results are reported as either positive or negative for HCV antibodies.  HCV RNA test results are reported as either positive or negative for HCV genetic material.  HCV genotype test  results are reported as which genotype of the virus you have. Genotypes are numbered 1 through 6.  Quantitative HCV RNA test results are reported as a number that indicates your viral load. This is given as international units of virus per milliliter of blood (IU/mL). ? A result of 800,000 IU/mL or greater is considered a high viral load. ? A result of less than 800,000 IU/mL is considered a low viral load. Sometimes, results from the anti-HCV test or the HCV RNA test may report that:  HCV antibodies or genetic material are present when they are not present (false-positive result).  HCV antibodies or genetic material are not present when they are present (false-negative result). What do the results mean? For the anti-HCV test:  A negative result may mean that you have not been infected with HCV. You may need to have this test done again to confirm this result.  A positive result may mean that you have an active HCV infection, or that you have been infected with HCV in the past. An HCV infection may not cause any symptoms, and your body may get rid of the virus without treatment. For the HCV RNA test:  A negative result means that you do not have an active HCV infection.  A positive result means that you have an active HCV infection. For the HCV genotype test, knowing the specific genotype you have will help your health care provider recommend the treatment that will work best  for you. The quantitative HCV RNA test gives your health care provider an idea of how well your treatment is working.  If your viral load is high, you may need different treatment.  If your viral load is low, your treatment may be working effectively.  You may have this test repeated to continue to monitor your treatment. Talk with your health care provider about what your results mean. Questions to ask your health care provider Ask your health care provider, or the department that is doing the test:  When will  my results be ready?  How will I get my results?  What are my treatment options?  What other tests do I need?  What are my next steps? Summary  The hepatitis C testing is done to check for a liver infection caused by the hepatitis C virus (HCV).  Hepatitis C is usually diagnosed with three blood tests and monitored with one test.  A blood sample is required for these tests. It is usually collected by inserting a needle into a blood vessel.  Your test results for both the anti-HCV test and the HCV RNA test will be reported as either positive or negative. This information is not intended to replace advice given to you by your health care provider. Make sure you discuss any questions you have with your health care provider. Document Revised: 06/03/2017 Document Reviewed: 05/09/2017 Elsevier Patient Education  2020 [employer].

## 2020-05-09 NOTE — Progress Notes (Addendum)
    SUBJECTIVE:   CHIEF COMPLAINT / HPI:   Mouth sore:  C/O sore in his mouth for a few days.   HTN/HLD:  He is compliant with his meds. Here for f/u. He is compliant with Norvasc 5 mg qd and metoprolol 25 mg BID.  OI/Weight management:  Stable. He is compliant with routine health screening. He had a recent dental evaluation and was informed that he has a periodontal cyst that does not border him. He has a f/u appointment set up with his dentist for reassessment. He has been doing well on calorie counting to avoid excessive weight gain to prevent bone fractures. His weight goal is around 60lbs.  HM: Here for his flu shot.  PERTINENT  PMH / PSH: PMX reviewed  OBJECTIVE:   Vitals:   05/09/20 1108  BP: 108/70  Pulse: 77  SpO2: 97%  Weight: 63 lb 9.6 oz (28.8 kg)  Height: 3' 1"  (0.94 m)   Physical Exam Vitals and nursing note reviewed.  HENT:     Head: Atraumatic.     Mouth/Throat:     Comments: Mild erythema of a small area of his inner upper lips with no ulceration. Cardiovascular:     Rate and Rhythm: Normal rate and regular rhythm.     Heart sounds: Normal heart sounds. No murmur heard.   Pulmonary:     Effort: Pulmonary effort is normal. No respiratory distress.     Breath sounds: Normal breath sounds. No wheezing or rhonchi.  Abdominal:     General: Abdomen is flat. Bowel sounds are normal. There is no distension.     Palpations: Abdomen is soft. There is no mass.     Tenderness: There is no abdominal tenderness.  Musculoskeletal:     Right lower leg: No edema.     Left lower leg: No edema.  Neurological:     Mental Status: He is alert and oriented to person, place, and time.     ASSESSMENT/PLAN:  Mouth sore:  ?? Mouth sore now resolving. Monitor for now. I reviewed his dental visit report from [facility]. + periodontal cyst. F/U with dentist as planned.  HYPERTENSION, BENIGN SYSTEMIC BP looks good on his current regimen.  Hyperlipidemia Previous lab result  reviewed and was discussed with him and his mother who was present for today's visit. Copy of lab report was printed for his mother at her request. Mom has his 72. Repeat FLP in April 2022.  OSTEOGENESIS IMPERFECTA, CONGENITAL No acute change. We discussed his diet and weight monitoring. I will check in with Dr. Jenne Campus if he needs follow-up appointment soon for weight management.    Flu shot given today for his health maintenance as well as hepatitis C screening.  Andrena Mews, MD [place]

## 2020-05-10 LAB — HEPATITIS C ANTIBODY: Hep C Virus Ab: <0.1 s/co ratio (ref 0.0–0.9)

## 2020-05-12 ENCOUNTER — Telehealth: Payer: Self-pay | Admitting: Family Medicine

## 2020-05-12 NOTE — Telephone Encounter (Signed)
Hep C result discussed with [name]. No further questions.

## 2020-06-05 NOTE — Progress Notes (Signed)
Medical Nutrition Therapy Appt start time: 1000 end time: 1100 (1 hour) Patient has completed 2nd dose of COVID-19 vaccine. Primary concerns today: Weight management and BP control.  Relevant history/background: Stanley [name] has been seen a number of times for MNT related to his BP and weight.  Stanley [name] admits to sometimes "sneaking" snacks or sweet drinks that have contributed to weight gain, although he agrees that keeping his weight around 61 lb is ideal for his being able to function best physically and to feel his best, in addition to likely helping in BP management.    Assessment:  Stanley [name] was accompanied at today's visit by his caregiver [name], a supervisor [place], and Pete's mom Zella Richer.  Stanley [name] stayed home most of the time during 18 mo of the pandemic.  This meant less access to snack foods from bus drivers or others he could ask for snacks or help in getting snacks.  Weight remained pretty stable at ~61 lb during this time.  He currently works M-F (1 day volunteering): Leaves for work 8:30/9 AM, works till 2 PM; rides [place] home, and gets home 3 or 4 PM.    Learning Readiness: Change in progress; [employer] virtually all food choices.  Lunch is sometimes microwavable meal, i.e., hot pockets, and fruit.    Usual eating pattern: 3 meals and 0-1 snack per day.   Frequent foods and beverages: water, diet flavored water, (diet Mtn Dew occasionally); chx pot pie, inst plain oatmeal, soup (sometimes canned), sandwiches, fruit, veg's.   Avoided foods: none.   Usual physical activity: limited b/c of osteogenesis imperfecta. Sleep: Estimates he gets 6-8 hrs of sleep per night.    24-hr recall: (Up at 10:30 AM) B ( AM)-    Snk ( AM)-   water L (12 PM)-  1 instant oatmeal, water Snk ( PM)-  water D (6:30 PM)-  1c beef stew, 1/2 rice, 1 c cabbage, 1 mini-croissant, diet flavored water Snk ( PM)-   Typical day? No. Usually has breakfast except some Saturdays and Sundays.  Breakfast on Sat or Sun is sometimes  [place] egg-chs-sausage biscuit.   Nutritional Diagnosis:  [place]-3.3 Overweight/obesity As related to best weight for BP management and physical function.  As evidenced by BMI of >32.    Handouts given during visit include:  After-Visit Summary (AVS)  Demonstrated degree of understanding via:  Teach Back  Barriers to learning/adherence to lifestyle change: Now that Stanley [name] is back to working outside home, he will sometimes have access to snacks and drinks.    Monitoring/Evaluation:  Dietary intake, exercise, and body weight prn.

## 2020-06-09 ENCOUNTER — Ambulatory Visit (INDEPENDENT_AMBULATORY_CARE_PROVIDER_SITE_OTHER): Payer: Medicare Other | Admitting: Family Medicine

## 2020-06-09 ENCOUNTER — Other Ambulatory Visit: Payer: Self-pay

## 2020-06-09 DIAGNOSIS — E669 Obesity, unspecified: Secondary | ICD-10-CM

## 2020-06-09 DIAGNOSIS — I10 Essential (primary) hypertension: Secondary | ICD-10-CM

## 2020-06-09 NOTE — Patient Instructions (Addendum)
Keep a full water bottle beside the bed each night, and drink some water first thing in the morning.    For best appetite control:  - Drink a glass of water before each meal.   - SLOW down eating.  Put fork down between bites.  Chew food fully before swallowing AND before you have a drink (do not wash your food down with your drink).  Pause now and then; breathe, and check in with how you feel.  Eat most vegetables first.   - Eat with regularity with no more than 5 hours between eating.  (Avoid getting over-hungry.) - Be sure to include lots of vegetables and fruits in the diet.    Breakfast:  - Continue with oatmeal; add fresh fruit if desired.   Lunch:  - Minimize use of processed foods, which are high in sodium, and many of which are also high in calories. - Canned soup can be used, but look for reduced sodium, and add frozen vegetables.  Pair with a half-sandwich for lunch.   - Aim for including a vegetable at lunch at least 4 X wk.  Use dinner leftovers as desired.   Dinner:  - Continue your usual of protein food, small serving of starch, and (lots of) vegetables.  Count corn as a starch, not a vegetable.    - Keep the sweets and salty snack foods minimal!  It's hard to beat fresh fruit as a snack.  Unsweetened applesauce is fine too.    Follow-up: as needed; call with any questions.

## 2020-06-30 ENCOUNTER — Other Ambulatory Visit: Payer: Self-pay | Admitting: Family Medicine

## 2020-07-07 ENCOUNTER — Other Ambulatory Visit: Payer: Medicare Other

## 2020-08-11 ENCOUNTER — Other Ambulatory Visit: Payer: Self-pay | Admitting: Family Medicine

## 2020-10-01 ENCOUNTER — Other Ambulatory Visit: Payer: Self-pay | Admitting: Family Medicine

## 2020-10-24 ENCOUNTER — Other Ambulatory Visit: Payer: Self-pay | Admitting: Family Medicine

## 2020-10-24 ENCOUNTER — Encounter: Payer: Self-pay | Admitting: Family Medicine

## 2020-10-24 ENCOUNTER — Other Ambulatory Visit: Payer: Self-pay

## 2020-10-24 ENCOUNTER — Ambulatory Visit (INDEPENDENT_AMBULATORY_CARE_PROVIDER_SITE_OTHER): Payer: Medicare Other | Admitting: Family Medicine

## 2020-10-24 ENCOUNTER — Ambulatory Visit (INDEPENDENT_AMBULATORY_CARE_PROVIDER_SITE_OTHER): Payer: Medicare Other

## 2020-10-24 VITALS — BP 129/75 | HR 77 | Ht <= 58 in | Wt <= 1120 oz

## 2020-10-24 DIAGNOSIS — Z23 Encounter for immunization: Secondary | ICD-10-CM | POA: Diagnosis not present

## 2020-10-24 DIAGNOSIS — F29 Unspecified psychosis not due to a substance or known physiological condition: Secondary | ICD-10-CM

## 2020-10-24 DIAGNOSIS — I10 Essential (primary) hypertension: Secondary | ICD-10-CM

## 2020-10-24 DIAGNOSIS — F339 Major depressive disorder, recurrent, unspecified: Secondary | ICD-10-CM

## 2020-10-24 DIAGNOSIS — Z0000 Encounter for general adult medical examination without abnormal findings: Secondary | ICD-10-CM

## 2020-10-24 DIAGNOSIS — Z862 Personal history of diseases of the blood and blood-forming organs and certain disorders involving the immune mechanism: Secondary | ICD-10-CM | POA: Diagnosis not present

## 2020-10-24 NOTE — Progress Notes (Addendum)
Patient ID: Stanley [name], male   DOB: [birth year], 45 y.o.   MRN: [id number]  Chief Complaint  Patient presents with   Annual Exam    HPI Stanley [name] is a 45 y.o. male.  Here for yearly physical and lab work as well as medication reconciliation.No concerns    Review of Systems Review of Systems  All other systems reviewed and are negative.   Blood pressure 129/75, pulse 77, height 3' 1"  (0.94 m), weight 64 lb 6 oz (29.2 kg), SpO2 97 %.  Physical Exam Physical Exam Vitals and nursing note reviewed.  HENT:     Head: Atraumatic.     Right Ear: Tympanic membrane normal.     Left Ear: Tympanic membrane normal.  Eyes:     Extraocular Movements: Extraocular movements intact.     Pupils: Pupils are equal, round, and reactive to light.  Cardiovascular:     Rate and Rhythm: Normal rate and regular rhythm.     Heart sounds: Normal heart sounds. No murmur heard.   Pulmonary:     Effort: Pulmonary effort is normal. No respiratory distress.     Breath sounds: Normal breath sounds. No wheezing.     Comments: Faint right basal crackles Abdominal:     General: Bowel sounds are normal.     Palpations: Abdomen is soft. There is no mass.  Musculoskeletal:     Cervical back: Neck supple.     Right lower leg: No edema.     Left lower leg: No edema.  Lymphadenopathy:     Cervical: No cervical adenopathy.  Neurological:     Mental Status: He is alert.     Data Reviewed Previous labs  Assessment/Plan Healthy adult with OI. No acute change from baseline. COVID-19 4th booster dose was given today Medication reviewed and reconciled. Antipsychotic medications for his MDD and Psychosis managed by his Psychiatrist was reviewed. CBC, Bmet, FLP checked. I will contact him soon with his results.  NB: Right basal lung crackles on exam likely atelectasis. Patient reassured.        Kalina Morabito 10/24/2020, 11:22 AM

## 2020-10-24 NOTE — Patient Instructions (Signed)
Preventive Care 29-45 Years Old, Male Preventive care refers to lifestyle choices and visits with your health care provider that can promote health and wellness. This includes:  A yearly physical exam. This is also called an annual wellness visit.  Regular dental and eye exams.  Immunizations.  Screening for certain conditions.  Healthy lifestyle choices, such as: ? Eating a healthy diet. ? Getting regular exercise. ? Not using drugs or products that contain nicotine and tobacco. ? Limiting alcohol use. What can I expect for my preventive care visit? Physical exam Your health care provider will check your:  Height and weight. These may be used to calculate your BMI (body mass index). BMI is a measurement that tells if you are at a healthy weight.  Heart rate and blood pressure.  Body temperature.  Skin for abnormal spots. Counseling Your health care provider may ask you questions about your:  Past medical problems.  Family's medical history.  Alcohol, tobacco, and drug use.  Emotional well-being.  Home life and relationship well-being.  Sexual activity.  Diet, exercise, and sleep habits.  Work and work Statistician.  Access to firearms. What immunizations do I need? Vaccines are usually given at various ages, according to a schedule. Your health care provider will recommend vaccines for you based on your age, medical history, and lifestyle or other factors, such as travel or where you work.   What tests do I need? Blood tests  Lipid and cholesterol levels. These may be checked every 5 years, or more often if you are over 20 years old.  Hepatitis C test.  Hepatitis B test. Screening  Lung cancer screening. You may have this screening every year starting at age 40 if you have a 30-pack-year history of smoking and currently smoke or have quit within the past 15 years.  Prostate cancer screening. Recommendations will vary depending on your family history and  other risks.  Genital exam to check for testicular cancer or hernias.  Colorectal cancer screening. ? All adults should have this screening starting at age 77 and continuing until age 66. ? Your health care provider may recommend screening at age 63 if you are at increased risk. ? You will have tests every 1-10 years, depending on your results and the type of screening test.  Diabetes screening. ? This is done by checking your blood sugar (glucose) after you have not eaten for a while (fasting). ? You may have this done every 1-3 years.  STD (sexually transmitted disease) testing, if you are at risk. Follow these instructions at home: Eating and drinking  Eat a diet that includes fresh fruits and vegetables, whole grains, lean protein, and low-fat dairy products.  Take vitamin and mineral supplements as recommended by your health care provider.  Do not drink alcohol if your health care provider tells you not to drink.  If you drink alcohol: ? Limit how much you have to 0-2 drinks a day. ? Be aware of how much alcohol is in your drink. In the [place] one drink equals one 12 oz bottle of beer (355 mL), one 5 oz glass of wine (148 mL), or one 1 oz glass of hard liquor (44 mL).   Lifestyle  Take daily care of your teeth and gums. Brush your teeth every morning and night with fluoride toothpaste. Floss one time each day.  Stay active. Exercise for at least 30 minutes 5 or more days each week.  Do not use any products that contain nicotine or  tobacco, such as cigarettes, e-cigarettes, and chewing tobacco. If you need help quitting, ask your health care provider.  Do not use drugs.  If you are sexually active, practice safe sex. Use a condom or other form of protection to prevent STIs (sexually transmitted infections).  If told by your health care provider, take low-dose aspirin daily starting at age 46.  Find healthy ways to cope with stress, such as: ? Meditation, yoga, or  listening to music. ? Journaling. ? Talking to a trusted person. ? Spending time with friends and family. Safety  Always wear your seat belt while driving or riding in a vehicle.  Do not drive: ? If you have been drinking alcohol. Do not ride with someone who has been drinking. ? When you are tired or distracted. ? While texting.  Wear a helmet and other protective equipment during sports activities.  If you have firearms in your house, make sure you follow all gun safety procedures. What's next?  Go to your health care provider once a year for an annual wellness visit.  Ask your health care provider how often you should have your eyes and teeth checked.  Stay up to date on all vaccines. This information is not intended to replace advice given to you by your health care provider. Make sure you discuss any questions you have with your health care provider. Document Revised: 03/20/2019 Document Reviewed: 06/15/2018 Elsevier Patient Education  2021 [employer].

## 2020-10-24 NOTE — Assessment & Plan Note (Signed)
Antipsychotic medications for his MDD and Psychosis managed by his Psychiatrist was reviewed.

## 2020-10-25 LAB — BASIC METABOLIC PANEL
BUN/Creatinine Ratio: 29 — ABNORMAL HIGH (ref 9–20)
BUN: 10 mg/dL (ref 6–24)
CO2: 24 mmol/L (ref 20–29)
Calcium: 9 mg/dL (ref 8.7–10.2)
Chloride: 105 mmol/L (ref 96–106)
Creatinine, Ser: 0.35 mg/dL — ABNORMAL LOW (ref 0.76–1.27)
Glucose: 94 mg/dL (ref 65–99)
Potassium: 4 mmol/L (ref 3.5–5.2)
Sodium: 145 mmol/L — ABNORMAL HIGH (ref 134–144)
eGFR: 144 mL/min/{1.73_m2} (ref >59)

## 2020-10-25 LAB — CBC WITH DIFFERENTIAL/PLATELET
Basophils Absolute: 0 10*3/uL (ref 0.0–0.2)
Basos: 1 %
EOS (ABSOLUTE): 0.3 10*3/uL (ref 0.0–0.4)
Eos: 6 %
Hematocrit: 41.7 % (ref 37.5–51.0)
Hemoglobin: 13.8 g/dL (ref 13.0–17.7)
Immature Grans (Abs): 0 10*3/uL (ref 0.0–0.1)
Immature Granulocytes: 0 %
Lymphocytes Absolute: 1.7 10*3/uL (ref 0.7–3.1)
Lymphs: 30 %
MCH: 29.9 pg (ref 26.6–33.0)
MCHC: 33.1 g/dL (ref 31.5–35.7)
MCV: 91 fL (ref 79–97)
Monocytes Absolute: 0.5 10*3/uL (ref 0.1–0.9)
Monocytes: 10 %
Neutrophils Absolute: 2.9 10*3/uL (ref 1.4–7.0)
Neutrophils: 53 %
Platelets: 197 10*3/uL (ref 150–450)
RBC: 4.61 x10E6/uL (ref 4.14–5.80)
RDW: 12.2 % (ref 11.6–15.4)
WBC: 5.5 10*3/uL (ref 3.4–10.8)

## 2020-10-25 LAB — LIPID PANEL
Chol/HDL Ratio: 3.3 ratio (ref 0.0–5.0)
Cholesterol, Total: 167 mg/dL (ref 100–199)
HDL: 50 mg/dL (ref >39)
LDL Chol Calc (NIH): 108 mg/dL — ABNORMAL HIGH (ref 0–99)
Triglycerides: 41 mg/dL (ref 0–149)
VLDL Cholesterol Cal: 9 mg/dL (ref 5–40)

## 2020-10-27 ENCOUNTER — Other Ambulatory Visit: Payer: Self-pay | Admitting: Family Medicine

## 2020-10-27 ENCOUNTER — Telehealth: Payer: Self-pay | Admitting: Family Medicine

## 2020-10-27 DIAGNOSIS — E870 Hyperosmolality and hypernatremia: Secondary | ICD-10-CM

## 2020-10-27 NOTE — Telephone Encounter (Signed)
HIPAA compliant callback messages left on his mobile and home number.  Please advise him that his sodium level is a bit elevated. Encourage hydration and recheck in about a week or so.  Cholesterol improved a lot. ASCVD/cardiovascular event risk diminished from 1.9% to 1.5%. Continue lifestyle modification.

## 2020-10-27 NOTE — Telephone Encounter (Signed)
His caretaker returned my call. Result discussed and lab f/u appointment was made.

## 2020-11-03 ENCOUNTER — Other Ambulatory Visit: Payer: Medicare Other

## 2020-11-04 ENCOUNTER — Other Ambulatory Visit: Payer: Medicare Other

## 2020-11-05 ENCOUNTER — Other Ambulatory Visit: Payer: Self-pay

## 2020-11-05 ENCOUNTER — Other Ambulatory Visit: Payer: Medicare Other

## 2020-11-05 DIAGNOSIS — E870 Hyperosmolality and hypernatremia: Secondary | ICD-10-CM

## 2020-11-06 ENCOUNTER — Telehealth: Payer: Self-pay | Admitting: Family Medicine

## 2020-11-06 LAB — BASIC METABOLIC PANEL
BUN/Creatinine Ratio: 20 (ref 9–20)
BUN: 7 mg/dL (ref 6–24)
CO2: 20 mmol/L (ref 20–29)
Calcium: 9.2 mg/dL (ref 8.7–10.2)
Chloride: 104 mmol/L (ref 96–106)
Creatinine, Ser: 0.35 mg/dL — ABNORMAL LOW (ref 0.76–1.27)
Glucose: 73 mg/dL (ref 65–99)
Potassium: 4.5 mmol/L (ref 3.5–5.2)
Sodium: 141 mmol/L (ref 134–144)
eGFR: 144 mL/min/{1.73_m2} (ref >59)

## 2020-11-06 NOTE — Telephone Encounter (Signed)
I discussed normal sodium result with is caretaker. Continue adequate hydration.

## 2020-11-19 ENCOUNTER — Telehealth: Payer: Self-pay | Admitting: Family Medicine

## 2020-11-19 NOTE — Telephone Encounter (Signed)
I called and discussed his [employer] wheelchair form completion request with his caretaker - [employer].  She stated that they requested this repair for his feet rest.  Form reviewed and has been completed.  I have placed the form in the fax box for processing.  [name] requested that I update his emergency contact person from [place] to her. This change has been made.

## 2020-12-03 ENCOUNTER — Other Ambulatory Visit: Payer: Self-pay | Admitting: Family Medicine

## 2021-01-29 ENCOUNTER — Telehealth: Payer: Self-pay

## 2021-01-29 NOTE — Telephone Encounter (Signed)
Received phone call from Caren Griffins at [employer] regarding paperwork for power wheelchair repair. She states that they need this additional paperwork filled out and it is not a duplicate.   Paperwork is in provider's box.   Talbot Grumbling, RN

## 2021-01-30 NOTE — Telephone Encounter (Signed)
I have completed the form and placed it in the front office to be faxed.

## 2021-02-16 ENCOUNTER — Other Ambulatory Visit: Payer: Self-pay | Admitting: Family Medicine

## 2021-03-03 ENCOUNTER — Other Ambulatory Visit: Payer: Self-pay | Admitting: Family Medicine

## 2021-03-30 ENCOUNTER — Other Ambulatory Visit: Payer: Self-pay | Admitting: Family Medicine

## 2021-04-21 ENCOUNTER — Other Ambulatory Visit: Payer: Self-pay

## 2021-04-21 ENCOUNTER — Ambulatory Visit (INDEPENDENT_AMBULATORY_CARE_PROVIDER_SITE_OTHER): Payer: Medicare Other

## 2021-04-21 DIAGNOSIS — Z23 Encounter for immunization: Secondary | ICD-10-CM

## 2021-05-11 ENCOUNTER — Ambulatory Visit (INDEPENDENT_AMBULATORY_CARE_PROVIDER_SITE_OTHER): Payer: Medicare Other | Admitting: Family Medicine

## 2021-05-11 ENCOUNTER — Other Ambulatory Visit: Payer: Self-pay

## 2021-05-11 DIAGNOSIS — E669 Obesity, unspecified: Secondary | ICD-10-CM | POA: Diagnosis not present

## 2021-05-11 NOTE — Progress Notes (Signed)
Medical Nutrition Therapy Appt start time: 1000 end time: 1100 (1 hour) Patient has completed 2nd dose of COVID-19 vaccine. Primary concerns today: Weight management and BP control .  Relevant history/background: Stanley [name] has struggled for a number of years in keeping his weight down, and he has found that a weight of ~61 lb is ideal for being able to function and feel his best, in addition to likely helping in BP management.  Stanley [name] had gained up to 67 lb by 03/31/21, but has managed to lose ~4.7 lb since then with Antonio's help.    Assessment:  Stanley [name] was accompanied at today's visit by his aide Orene Desanctis and his mom Zella Richer.  Antonio does all food prep, and he has the meal plan provided to [place] from a previous MNT appt.  They get restaurant or takeout foods usually once a week.   Stanley [name] currently works 9:30-2 PM T-F, and volunteers on Mondays.  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      Learning Readiness: Change in progress. Stanley [name] seems to be on board with Antonio's strict adherence to dietary recommendations previously provided.    Usual eating pattern: 3 meals and 0-1 snack per day.   Frequent foods and beverages: water, 20 oz coffee with skim milk & 4 Splenda, green tea; chx pot pie, inst plain oatmeal, soup (sometimes canned), sandwiches, fruit, veg's.   Avoided foods: Has some difficulty with very hard/crunchy foods d/t missing some teeth.   Usual physical activity: limited b/c of osteogenesis imperfecta. Sleep: Estimates he gets 6-8 hrs of sleep per night.    24-hr recall:  (Up at 7 AM) B (8 AM)-  5 oz diet Grk yogurt (60 kcal), 2 tbsp granola, 7-8 grapes, 1 tbsp blueberries, 20 oz coffee & 1/3 c skim milk, 4 Spenda Snk ( AM)-  --- L (12 PM)-  1 entree spin salad w/ grapes, blueber's, 1 oz ham, car's, tom's, f-f drsng, Vitamin water Snk (3 PM)-  1/2 c peaches (30 kcal)  D (5:30 PM)-  Chili's: 1/4 [place] chx salad, diet swt tea Snk ( PM)-  --- Typical day? Yes.  Except only goes out ~1 X wk.   Lunch on weekdays: Salad (w/ [place], ham, or chx) or turk/ham sandwith on low-kcal bread and yogurt.  Consuming 1.5 to 2 liters water/day.    Nutritional Diagnosis:  [place]-3.3 Overweight/obesity As related to best weight for BP management and physical function.  As evidenced by BMI of >32.    Handouts given during visit include: After-Visit Summary (AVS)  Demonstrated degree of understanding via:  Teach Back  Barriers to learning/adherence to lifestyle change: Stanley [name] is getting tired of salads; branching out to other ways of providing vegetables will help keep his interest in the dietary changes already made.    Monitoring/Evaluation:  Dietary intake, exercise, and body weight as needed.

## 2021-05-11 NOTE — Patient Instructions (Addendum)
Water is your body's preferred source of fluid.  For something different once in a while, you might like flavored (not sweetened) seltzer.   Ideally, you will reduce the amount of Splenda in your coffee, starting with 3 instead of 4.  Maybe eventually, you can drink coffee with no sweetener.    Continue to have 3 meals and 1 snack daily, and to include a source of protein at each meal.    1,000 calories is still an appropriate level for daily intake.    Vegetables:  - To go with a sandwich: Cherry tomatoes, cucumbers, mushrooms, bell peppers, left-over cooked veg's. - Soups, stews, chili, spaghetti, lasagne:  All recipes to which you can add a lot of veg's.  For times you are short on time or ingredients, you can even use canned soup to which you add frozen veg's.    Follow-up: As needed.  Call or email with any Qs.

## 2021-05-15 ENCOUNTER — Telehealth: Payer: Self-pay

## 2021-05-15 NOTE — Telephone Encounter (Signed)
Antonio, [place] provider/ full time caregiver for patient calls nurse line with concerns for patient having forgetfulness and issues at work. Reports this has been going on since Wednesday of this week. 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 Denies pain, fever, signs of UTI, or any other changes in neurological status.  Reports that he has had these issues in the past when he has a "behavioral episode"   Advised calling patient's psychiatrist. Will also route to PCP for further recommendations.   ED precautions given.   Talbot Grumbling, RN

## 2021-05-29 ENCOUNTER — Other Ambulatory Visit: Payer: Self-pay | Admitting: Family Medicine

## 2021-06-05 NOTE — Telephone Encounter (Signed)
Attempted to reach caregiver. No answer. LVM of message below and to call the office if needing an appt to be seen. Salvatore Marvel, CMA

## 2021-07-14 ENCOUNTER — Telehealth: Payer: Self-pay | Admitting: Family Medicine

## 2021-07-14 NOTE — Telephone Encounter (Signed)
Form completion request received from [employer].I called patient and her confirms that he needs to replace his power wheelchair cable.  Form completed and placed in the front office for faxing.

## 2021-07-16 ENCOUNTER — Other Ambulatory Visit: Payer: Self-pay | Admitting: Family Medicine

## 2021-10-02 ENCOUNTER — Other Ambulatory Visit: Payer: Self-pay | Admitting: Family Medicine

## 2021-12-08 ENCOUNTER — Encounter: Payer: Self-pay | Admitting: *Deleted

## 2021-12-16 ENCOUNTER — Other Ambulatory Visit: Payer: Self-pay | Admitting: Family Medicine

## 2022-01-12 ENCOUNTER — Encounter: Payer: Self-pay | Admitting: Family Medicine

## 2022-01-12 ENCOUNTER — Ambulatory Visit (INDEPENDENT_AMBULATORY_CARE_PROVIDER_SITE_OTHER): Payer: Medicare Other | Admitting: Family Medicine

## 2022-01-12 VITALS — BP 104/62 | HR 74 | Ht <= 58 in | Wt <= 1120 oz

## 2022-01-12 DIAGNOSIS — Z0000 Encounter for general adult medical examination without abnormal findings: Secondary | ICD-10-CM | POA: Diagnosis present

## 2022-01-12 DIAGNOSIS — I10 Essential (primary) hypertension: Secondary | ICD-10-CM

## 2022-01-12 DIAGNOSIS — H543 Unqualified visual loss, both eyes: Secondary | ICD-10-CM

## 2022-01-12 DIAGNOSIS — Z1211 Encounter for screening for malignant neoplasm of colon: Secondary | ICD-10-CM

## 2022-01-12 DIAGNOSIS — E785 Hyperlipidemia, unspecified: Secondary | ICD-10-CM | POA: Diagnosis not present

## 2022-01-12 DIAGNOSIS — Q780 Osteogenesis imperfecta: Secondary | ICD-10-CM

## 2022-01-12 DIAGNOSIS — D649 Anemia, unspecified: Secondary | ICD-10-CM | POA: Diagnosis not present

## 2022-01-12 LAB — POCT HEMOGLOBIN: Hemoglobin: 15.4 g/dL — AB (ref 11–14.6)

## 2022-01-12 NOTE — Patient Instructions (Addendum)
It was nice seeing you today. You physical exam completed today and it seems you are in good health. Please, schedule follow-up with your eye doctor for your yearly eye exam. We want you to get your ECHO done for routine aortic vessel monitoring. I will see you back in 1 year as needed.  [id number] - Mychart user ID  Nickolas: Password   Please, change your password when you get home.

## 2022-01-12 NOTE — Assessment & Plan Note (Signed)
FLP checked today. Note: This is a non-fasting lab.

## 2022-01-12 NOTE — Progress Notes (Signed)
Subjective:     Stanley [name] is a 46 y.o. male and is here for a comprehensive physical exam. The patient reports no problems. Wants to get hemoglobin check. Sometimes he gets sleepy during the evening time and wonders if his hemoglobin dropped.He also need a new power wheelchair. He has been using his current one for 5 years.    Social History   Socioeconomic History   Marital status: Single    Spouse name: Not on file   Number of children: Not on file   Years of education: Not on file   Highest education level: Not on file  Occupational History   Occupation: Disabled    Comment: lives in group home, non ambulatory  Tobacco Use   Smoking status: Former    Packs/day: 1.00    Years: 4.00    Total pack years: 4.00    Types: Cigarettes    Start date: 07/05/2002   Smokeless tobacco: Never  Substance and Sexual Activity   Alcohol use: Yes    Comment: occasional   Drug use: No   Sexual activity: Not on file  Other Topics Concern   Not on file  Social History Narrative   Not on file   Social Determinants of Health   Financial Resource Strain: Not on file  Food Insecurity: Not on file  Transportation Needs: Not on file  Physical Activity: Not on file  Stress: Not on file  Social Connections: Not on file  Intimate Partner Violence: Not on file   Health Maintenance  Topic Date Due   COLONOSCOPY (Pts 45-42yr Insurance coverage will need to be confirmed)  Never done   TETANUS/TDAP  06/14/2021   COVID-19 Vaccine (6 - Booster for PNeolaseries) 06/16/2021   INFLUENZA VACCINE  02/02/2022   Hepatitis C Screening  Completed   HIV Screening  Completed   HPV VACCINES  Aged Out    The following portions of the patient's history were reviewed and updated as appropriate: allergies, current medications, past family history, past medical history, past social history, past surgical history, and problem list.  Review of Systems Pertinent items noted in HPI and remainder of  comprehensive ROS otherwise negative.   Objective:    BP 104/62   Pulse 74   Ht '3\' 1"'$  (0.94 m)   Wt 62 lb 12.8 oz (28.5 kg)   SpO2 100%   BMI 32.25 kg/m  General appearance: alert and cooperative Head: Normocephalic, without obvious abnormality, atraumatic Eyes: conjunctivae/corneas clear. PERRL, EOM's intact. Fundi benign. Ears: normal TM's and external ear canals both ears Throat: no oropharyngeal erythema. Missing central and lateral lower incisiors Neck: no adenopathy, no carotid bruit, no JVD, supple, symmetrical, trachea midline, and thyroid not enlarged, symmetric, no tenderness/mass/nodules Lungs: clear to auscultation bilaterally Heart: regular rate and rhythm, S1, S2 normal, no murmur, click, rub or gallop Abdomen: soft, non-tender; bowel sounds normal; no masses,  no organomegaly Extremities: no edema Skin: Skin color, texture, turgor normal. No rashes or lesions Neurologic: Grossly normal    Assessment:    Healthy male exam.      Plan:  No acute findings on his exam. Information was provided on Tdap and COVID-19 vaccination. We are currently out of COVID-19 vaccination. He is advised to obtain both vaccines at his pharmacy. Colon cancer screening was discussed. He opted for a cologard which was ordered today. He did endorse hx of GI bleed. If his cologard test positive, he is aware of the need for a  GI referral and agrees with the plan. I discussed the need for a yearly eye exam.  His ophthalmologist is Dr. Valetta Close. He will call for an appointment. She is compliant with four monthly Dental appointments. ECHO ordered to assess his aortic root due to his OI status. I gave him a prescription for a new electric wheelchair and wrote him a letter.  Note that his MyChart password was updated during this visit per his request. He will change his password to a different one once he returns home. Otherwise, his BP looks great, and he is compliant with his  antidepressants. Lab completed today includes Cmet, FLP, and POC hemoglobin. Hgb result discussed. I will contact him soon with his other test results.    See After Visit Summary for Counseling Recommendations

## 2022-01-12 NOTE — Assessment & Plan Note (Signed)
BP looks good today. Medication reviewed.

## 2022-01-12 NOTE — Assessment & Plan Note (Signed)
I discussed the need for a yearly eye exam.  His ophthalmologist is Dr. Valetta Close. He will call for an appointment. She is compliant with four monthly Dental appointments. ECHO ordered to assess his aortic root due to his OI status. I gave him a prescription for a new electric wheelchair and wrote him a letter.

## 2022-01-12 NOTE — Assessment & Plan Note (Signed)
He will contact his ophthalmologist for an eye exam.

## 2022-01-13 ENCOUNTER — Telehealth: Payer: Self-pay | Admitting: Family Medicine

## 2022-01-13 LAB — CMP14+EGFR
ALT: 24 IU/L (ref 0–44)
AST: 22 IU/L (ref 0–40)
Albumin/Globulin Ratio: 1.8 (ref 1.2–2.2)
Albumin: 4.6 g/dL (ref 4.1–5.1)
Alkaline Phosphatase: 69 IU/L (ref 44–121)
BUN/Creatinine Ratio: 25 — ABNORMAL HIGH (ref 9–20)
BUN: 10 mg/dL (ref 6–24)
Bilirubin Total: 0.3 mg/dL (ref 0.0–1.2)
CO2: 24 mmol/L (ref 20–29)
Calcium: 9.4 mg/dL (ref 8.7–10.2)
Chloride: 103 mmol/L (ref 96–106)
Creatinine, Ser: 0.4 mg/dL — ABNORMAL LOW (ref 0.76–1.27)
Globulin, Total: 2.6 g/dL (ref 1.5–4.5)
Glucose: 68 mg/dL — ABNORMAL LOW (ref 70–99)
Potassium: 4.1 mmol/L (ref 3.5–5.2)
Sodium: 144 mmol/L (ref 134–144)
Total Protein: 7.2 g/dL (ref 6.0–8.5)
eGFR: 137 mL/min/{1.73_m2} (ref >59)

## 2022-01-13 LAB — LIPID PANEL
Chol/HDL Ratio: 4.6 ratio (ref 0.0–5.0)
Cholesterol, Total: 205 mg/dL — ABNORMAL HIGH (ref 100–199)
HDL: 45 mg/dL (ref >39)
LDL Chol Calc (NIH): 149 mg/dL — ABNORMAL HIGH (ref 0–99)
Triglycerides: 62 mg/dL (ref 0–149)
VLDL Cholesterol Cal: 11 mg/dL (ref 5–40)

## 2022-01-13 NOTE — Telephone Encounter (Signed)
Result discussed with her.  Glucose low at 76 - I encouraged age appropriate 3 quare meal. Currently asymptomatic. Monitor for now.  FLP abnormal - This was a non-fasting lab. He will work on diet and exercise. Return in 6 months for repeat FLP. He agreed with the plan.

## 2022-01-19 ENCOUNTER — Other Ambulatory Visit: Payer: Self-pay | Admitting: Family Medicine

## 2022-01-25 ENCOUNTER — Ambulatory Visit (HOSPITAL_COMMUNITY)
Admission: RE | Admit: 2022-01-25 | Discharge: 2022-01-25 | Disposition: A | Discharge status: Home / Self Care | Payer: Medicare Other | Source: Ambulatory Visit | Attending: Family Medicine | Admitting: Family Medicine

## 2022-01-25 DIAGNOSIS — E785 Hyperlipidemia, unspecified: Secondary | ICD-10-CM | POA: Insufficient documentation

## 2022-01-25 DIAGNOSIS — I10 Essential (primary) hypertension: Secondary | ICD-10-CM | POA: Diagnosis not present

## 2022-01-25 DIAGNOSIS — R008 Other abnormalities of heart beat: Secondary | ICD-10-CM | POA: Diagnosis present

## 2022-01-25 LAB — ECHOCARDIOGRAM COMPLETE
Area-P 1/2: 5.13 cm2
S' Lateral: 2.6 cm

## 2022-01-25 NOTE — Progress Notes (Signed)
  Echocardiogram 2D Echocardiogram has been performed.  Johny Chess 01/25/2022, 11:47 AM

## 2022-01-31 LAB — COLOGUARD: COLOGUARD: NEGATIVE

## 2022-02-08 ENCOUNTER — Encounter: Payer: Self-pay | Admitting: Family Medicine

## 2022-02-17 ENCOUNTER — Other Ambulatory Visit: Payer: Self-pay | Admitting: Family Medicine

## 2022-03-16 ENCOUNTER — Other Ambulatory Visit: Payer: Self-pay | Admitting: Family Medicine

## 2022-03-22 ENCOUNTER — Telehealth: Payer: Self-pay

## 2022-03-22 NOTE — Telephone Encounter (Signed)
[employer] calls nurse line in regards to power wheel chair order.   [employer] reports they will be faxing over an order for PCP to sign off on.   Will forward to PCP.   Please let me know if order is not received.

## 2022-03-24 NOTE — Telephone Encounter (Signed)
Form completed and placed in the fax box. 

## 2022-04-13 ENCOUNTER — Ambulatory Visit (INDEPENDENT_AMBULATORY_CARE_PROVIDER_SITE_OTHER): Payer: Medicare Other

## 2022-04-13 DIAGNOSIS — Z23 Encounter for immunization: Secondary | ICD-10-CM | POA: Diagnosis present

## 2022-04-15 NOTE — Progress Notes (Signed)
Patient presents to nurse clinic for flu vaccination. Administered in LD, site unremarkable, tolerated injection well.   Updated immunization record provided for patient.   Talbot Grumbling, RN

## 2022-06-17 ENCOUNTER — Other Ambulatory Visit: Payer: Self-pay | Admitting: Family Medicine

## 2022-08-15 NOTE — Progress Notes (Unsigned)
I connected with  Stanley [name] on 08/16/2022 by a audio enabled telemedicine application and verified that I am speaking with the correct person using two identifiers.  Patient Location: Home  Provider Location: Office/Clinic  I discussed the limitations of evaluation and management by telemedicine. The patient expressed understanding and agreed to proceed.  Subjective:   Stanley [name] is a 47 y.o. male who presents for an Initial Medicare Annual Wellness Visit.  Review of Systems    Per HPI unless specifically indicated below.  Cardiac Risk Factors include: advanced age (>17mn, >>76women);male gender, Hypertension, and Hyperlipidemia.           Objective:       01/12/2022   10:41 AM 05/11/2021    3:29 PM 10/24/2020   11:17 AM  Vitals with BMI  Height 3' 1"$  3' 1"$  3' 1"$   Weight 62 lbs 13 oz 62 lbs 8 oz 64 lbs 6 oz  BMI 32.24 [id number] Systolic [id number] Diastolic 62  75  Pulse 74  77    There were no vitals filed for this visit. There is no height or weight on file to calculate BMI.     01/12/2022   10:43 AM 05/09/2020   11:09 AM 10/23/2019    9:14 AM 05/19/2018   11:40 AM 03/29/2017   10:58 AM 06/08/2016    9:45 AM 03/09/2016    9:06 AM  Advanced Directives  Does Patient Have a Medical Advance Directive? No No No No No No No  Would patient like information on creating a medical advance directive? No - Patient declined No - Patient declined No - Patient declined No - Patient declined No - Patient declined No - Patient declined No - patient declined information    Current Medications (verified) Outpatient Encounter Medications as of 08/16/2022  Medication Sig   acetaminophen (TYLENOL 8 HOUR) 650 MG CR tablet Take 1 tablet (650 mg total) by mouth every 8 (eight) hours as needed for pain. (Patient not taking: Reported on 09/14/2018)   amLODipine (NORVASC) 5 MG tablet TAKE ONE TABLET BY MOUTH DAILY   ARIPiprazole (ABILIFY) 20 MG tablet Take 15 mg by mouth  daily. Prescribed by [facility]   Calcium Carb-Cholecalciferol 500-5 MG-MCG TABS TAKE ONE TABLET BY MOUTH TWICE A DAY   escitalopram (LEXAPRO) 10 MG tablet Take 2 tablets by mouth.   ferrous sulfate 325 (65 FE) MG tablet TAKE 1 TABLET (325 MG TOTAL) BY MOUTH 2 (TWO) TIMES DAILY.   fexofenadine (ALLEGRA) 180 MG tablet TAKE ONE TABLET BY MOUTH DAILY   methylphenidate (CONCERTA) 36 MG CR tablet Take 1 tablet (36 mg total) by mouth daily. Prescribed by [facility]   metoprolol tartrate (LOPRESSOR) 25 MG tablet TAKE ONE TABLET BY MOUTH TWICE A DAY FOR BLOOD PRESSURE   Multiple Vitamins-Minerals (MULTIVITAMIN MEN) TABS Take 1 tablet by mouth daily.   Omega-3 Fatty Acids (FISH OIL) 1000 MG CAPS Take 1 capsule (1,000 mg total) by mouth in the morning and at bedtime.   omeprazole (PRILOSEC) 20 MG capsule TAKE ONE CAPSULE BY MOUTH TWICE A DAY AS NEEDED   No facility-administered encounter medications on file as of 08/16/2022.    Allergies (verified) Aspirin and Nsaids   History: Past Medical History:  Diagnosis Date   Abnormal PFTs    Allergic rhinitis    Allergic rhinitis 09/01/2006   Asperger's disorder    Depression    Fecal urgency 05/14/2010  Evaluated by Dr. Fuller Plan, GI who recommends referral to [facility]. (02/24/11)     Fracture 01/12/2012   Multiple fractures after fall from wheelchair 12/2011    GERD (gastroesophageal reflux disease)    Hiatal hernia    Hypertension    Iron deficiency anemia    Osteogenesis imperfecta    Other urinary incontinence 07/03/2007   Qualifier: Diagnosis of  By: Jenne Campus PHD, Jeannie     Peptic ulcer    Polydipsia 04/11/2018   Psychosis ([place])    Seborrheic dermatitis    Seborrheic dermatitis 01/13/2016   Past Surgical History:  Procedure Laterality Date   DISTRACTION OSTEOGENESIS     22 surgeries for osetogenesis imperfecta per patient-rods placed   Family History  Adopted: Yes   Social History   Socioeconomic History    Marital status: Single    Spouse name: Not on file   Number of children: Not on file   Years of education: Not on file   Highest education level: Not on file  Occupational History   Occupation: Disabled    Comment: lives in group home, non ambulatory  Tobacco Use   Smoking status: Former    Packs/day: 1.00    Years: 4.00    Total pack years: 4.00    Types: Cigarettes    Start date: 07/05/2002   Smokeless tobacco: Never  Substance and Sexual Activity   Alcohol use: Yes    Comment: occasional   Drug use: No   Sexual activity: Not on file  Other Topics Concern   Not on file  Social History Narrative   Not on file   Social Determinants of Health   Financial Resource Strain: Not on file  Food Insecurity: Not on file  Transportation Needs: Not on file  Physical Activity: Not on file  Stress: Not on file  Social Connections: Not on file    Tobacco Counseling Counseling given: Not Answered   Clinical Intake:                 Diabetic?No         Activities of Daily Living     No data to display          Patient Care Team: Kinnie Feil, MD as PCP - General (Family Medicine)  Indicate any recent Medical Services you may have received from other than [facility] providers in the past year (date may be approximate).     Assessment:   This is a routine wellness examination for Stanley [name].  Hearing/Vision screen Denies any vision issues. Loss of hearing.   Dietary issues and exercise activities discussed:     Goals Addressed   None    Depression Screen    01/12/2022   10:43 AM 10/24/2020   11:16 AM 05/09/2020   11:09 AM 10/23/2019    9:13 AM 09/14/2018    4:16 PM 05/19/2018   11:40 AM 04/11/2018   10:57 AM  PHQ 2/9 Scores  PHQ - 2 Score 0 0 0 0 0 0 0  PHQ- 9 Score 0 0     1    Fall Risk    03/11/2015   11:00 AM  Fall Risk   Falls in the past year? No    FALL RISK PREVENTION PERTAINING TO THE HOME:  Any stairs in or around the home?  {YES/NO:21197} If so, are there any without handrails? {YES/NO:21197} Home free of loose throw rugs in walkways, pet beds, electrical cords, etc? {YES/NO:21197} Adequate lighting in your home  to reduce risk of falls? {YES/NO:21197}  ASSISTIVE DEVICES UTILIZED TO PREVENT FALLS:  Life alert? {YES/NO:21197} Use of a cane, walker or w/c? {YES/NO:21197} Grab bars in the bathroom? {YES/NO:21197} Shower chair or bench in shower? {YES/NO:21197} Elevated toilet seat or a handicapped toilet? {YES/NO:21197}  TIMED UP AND GO:  Was the test performed? Unable to perform, virtual appointment   Cognitive Function:        Immunizations Immunization History  Administered Date(s) Administered   Influenza Split 08/08/2012   Influenza Whole 04/19/2007, 03/28/2008, 07/29/2009, 05/14/2010   Influenza,inj,Quad PF,6+ Mos 08/14/2013, 04/22/2014, 03/11/2015, 03/09/2016, 03/29/2017, 04/11/2018, 04/12/2019, 05/09/2020, 04/21/2021, 04/13/2022   PFIZER Comirnaty(Gray Top)Covid-19 Tri-Sucrose Vaccine 10/24/2020, 04/21/2021   PFIZER(Purple Top)SARS-COV-2 Vaccination 09/02/2019, 10/03/2019, 05/09/2020   PPD Test 06/15/2011   Pneumococcal Polysaccharide-23 07/29/2009   Tdap 06/15/2011    TDAP status: Due, Education has been provided regarding the importance of this vaccine. Advised may receive this vaccine at local pharmacy or Health Dept. Aware to provide a copy of the vaccination record if obtained from local pharmacy or Health Dept. Verbalized acceptance and understanding.  Flu Vaccine status: Up to date    Covid-19 vaccine status: Information provided on how to obtain vaccines.   Qualifies for Shingles Vaccine? No   Screening Tests Health Maintenance  Topic Date Due   Medicare Annual Wellness (AWV)  Never done   COLONOSCOPY (Pts 45-61yr Insurance coverage will need to be confirmed)  Never done   DTaP/Tdap/Td (2 - Td or Tdap) 06/14/2021   COVID-19 Vaccine (6 - 2023-24 season) 03/05/2022    INFLUENZA VACCINE  Completed   Hepatitis C Screening  Completed   HIV Screening  Completed   HPV VACCINES  Aged Out    Health Maintenance  Health Maintenance Due  Topic Date Due   Medicare Annual Wellness (AWV)  Never done   COLONOSCOPY (Pts 45-49yrInsurance coverage will need to be confirmed)  Never done   DTaP/Tdap/Td (2 - Td or Tdap) 06/14/2021   COVID-19 Vaccine (6 - 2023-24 season) 03/05/2022    {Colorectal cancer screening:2101809}  Lung Cancer Screening: (Low Dose CT Chest recommended if Age 47-80ears, 30 pack-year currently smoking OR have quit w/in 15years.) does not qualify.   Lung Cancer Screening Referral: not applicable   Additional Screening:  Hepatitis C Screening: does qualify; Completed 05/09/2020  Vision Screening: Recommended annual ophthalmology exams for early detection of glaucoma and other disorders of the eye. Is the patient up to date with their annual eye exam?  {YES/NO:21197} Who is the provider or what is the name of the office in which the patient attends annual eye exams? *** If pt is not established with a provider, would they like to be referred to a provider to establish care? {YES/NO:21197}.   Dental Screening: Recommended annual dental exams for proper oral hygiene  Community Resource Referral / Chronic Care Management: CRR required this visit?  No   CCM required this visit?  No      Plan:     I have personally reviewed and noted the following in the patient's chart:   Medical and social history Use of alcohol, tobacco or illicit drugs  Current medications and supplements including opioid prescriptions. Patient is not currently taking opioid prescriptions. Functional ability and status Nutritional status Physical activity Advanced directives List of other physicians Hospitalizations, surgeries, and ER visits in previous 12 months Vitals Screenings to include cognitive, depression, and falls Referrals and appointments  In  addition, I have reviewed and discussed with patient  certain preventive protocols, quality metrics, and best practice recommendations. A written personalized care plan for preventive services as well as general preventive health recommendations were provided to patient.    Stanley [name] , Thank you for taking time to come for your Medicare Wellness Visit. I appreciate your ongoing commitment to your health goals. Please review the following plan we discussed and let me know if I can assist you in the future.   These are the goals we discussed:  Goals   None     This is a list of the screening recommended for you and due dates:  Health Maintenance  Topic Date Due   Colon Cancer Screening  Never done   DTaP/Tdap/Td vaccine (2 - Td or Tdap) 06/14/2021   COVID-19 Vaccine (6 - 2023-24 season) 03/05/2022   Medicare Annual Wellness Visit  08/17/2023   Flu Shot  Completed   Hepatitis C Screening: USPSTF Recommendation to screen - Ages 18-79 yo.  Completed   HIV Screening  Completed   HPV Vaccine  Aged [address] [place]   08/16/2022  Nurse Notes: Approximately 30 minute Non-Face -To-Face Medicare Wellness Visit

## 2022-08-15 NOTE — Patient Instructions (Signed)
Health Maintenance, Male Adopting a healthy lifestyle and getting preventive care are important in promoting health and wellness. Ask your health care provider about: The right schedule for you to have regular tests and exams. Things you can do on your own to prevent diseases and keep yourself healthy. What should I know about diet, weight, and exercise? Eat a healthy diet  Eat a diet that includes plenty of vegetables, fruits, low-fat dairy products, and lean protein. Do not eat a lot of foods that are high in solid fats, added sugars, or sodium. Maintain a healthy weight Body mass index (BMI) is a measurement that can be used to identify possible weight problems. It estimates body fat based on height and weight. Your health care provider can help determine your BMI and help you achieve or maintain a healthy weight. Get regular exercise Get regular exercise. This is one of the most important things you can do for your health. Most adults should: Exercise for at least 150 minutes each week. The exercise should increase your heart rate and make you sweat (moderate-intensity exercise). Do strengthening exercises at least twice a week. This is in addition to the moderate-intensity exercise. Spend less time sitting. Even light physical activity can be beneficial. Watch cholesterol and blood lipids Have your blood tested for lipids and cholesterol at 47 years of age, then have this test every 5 years. You may need to have your cholesterol levels checked more often if: Your lipid or cholesterol levels are high. You are older than 47 years of age. You are at high risk for heart disease. What should I know about cancer screening? Many types of cancers can be detected early and may often be prevented. Depending on your health history and family history, you may need to have cancer screening at various ages. This may include screening for: Colorectal cancer. Prostate cancer. Skin cancer. Lung  cancer. What should I know about heart disease, diabetes, and high blood pressure? Blood pressure and heart disease High blood pressure causes heart disease and increases the risk of stroke. This is more likely to develop in people who have high blood pressure readings or are overweight. Talk with your health care provider about your target blood pressure readings. Have your blood pressure checked: Every 3-5 years if you are 18-39 years of age. Every year if you are 40 years old or older. If you are between the ages of 65 and 75 and are a current or former smoker, ask your health care provider if you should have a one-time screening for abdominal aortic aneurysm (AAA). Diabetes Have regular diabetes screenings. This checks your fasting blood sugar level. Have the screening done: Once every three years after age 45 if you are at a normal weight and have a low risk for diabetes. More often and at a younger age if you are overweight or have a high risk for diabetes. What should I know about preventing infection? Hepatitis B If you have a higher risk for hepatitis B, you should be screened for this virus. Talk with your health care provider to find out if you are at risk for hepatitis B infection. Hepatitis C Blood testing is recommended for: Everyone born from 1945 through 1965. Anyone with known risk factors for hepatitis C. Sexually transmitted infections (STIs) You should be screened each year for STIs, including gonorrhea and chlamydia, if: You are sexually active and are younger than 47 years of age. You are older than 47 years of age and your   health care provider tells you that you are at risk for this type of infection. Your sexual activity has changed since you were last screened, and you are at increased risk for chlamydia or gonorrhea. Ask your health care provider if you are at risk. Ask your health care provider about whether you are at high risk for HIV. Your health care provider  may recommend a prescription medicine to help prevent HIV infection. If you choose to take medicine to prevent HIV, you should first get tested for HIV. You should then be tested every 3 months for as long as you are taking the medicine. Follow these instructions at home: Alcohol use Do not drink alcohol if your health care provider tells you not to drink. If you drink alcohol: Limit how much you have to 0-2 drinks a day. Know how much alcohol is in your drink. In the [place] one drink equals one 12 oz bottle of beer (355 mL), one 5 oz glass of wine (148 mL), or one 1 oz glass of hard liquor (44 mL). Lifestyle Do not use any products that contain nicotine or tobacco. These products include cigarettes, chewing tobacco, and vaping devices, such as e-cigarettes. If you need help quitting, ask your health care provider. Do not use street drugs. Do not share needles. Ask your health care provider for help if you need support or information about quitting drugs. General instructions Schedule regular health, dental, and eye exams. Stay current with your vaccines. Tell your health care provider if: You often feel depressed. You have ever been abused or do not feel safe at home. Summary Adopting a healthy lifestyle and getting preventive care are important in promoting health and wellness. Follow your health care provider's instructions about healthy diet, exercising, and getting tested or screened for diseases. Follow your health care provider's instructions on monitoring your cholesterol and blood pressure. This information is not intended to replace advice given to you by your health care provider. Make sure you discuss any questions you have with your health care provider. Document Revised: 11/10/2020 Document Reviewed: 11/10/2020 Elsevier Patient Education  2023 Elsevier Inc.  

## 2022-08-16 ENCOUNTER — Ambulatory Visit (INDEPENDENT_AMBULATORY_CARE_PROVIDER_SITE_OTHER): Payer: Medicare Other

## 2022-08-16 DIAGNOSIS — Z0000 Encounter for general adult medical examination without abnormal findings: Secondary | ICD-10-CM

## 2022-08-18 ENCOUNTER — Other Ambulatory Visit: Payer: Self-pay | Admitting: Family Medicine

## 2022-09-01 ENCOUNTER — Other Ambulatory Visit: Payer: Self-pay | Admitting: Family Medicine

## 2022-09-21 ENCOUNTER — Other Ambulatory Visit: Payer: Self-pay | Admitting: Family Medicine

## 2022-11-04 ENCOUNTER — Telehealth: Payer: Self-pay

## 2022-11-04 ENCOUNTER — Telehealth (INDEPENDENT_AMBULATORY_CARE_PROVIDER_SITE_OTHER): Payer: Medicare Other | Admitting: Family Medicine

## 2022-11-04 DIAGNOSIS — U071 COVID-19: Secondary | ICD-10-CM

## 2022-11-04 MED ORDER — MOLNUPIRAVIR 200 MG PO CAPS: 4.0000 | ORAL_CAPSULE | Freq: Two times a day (BID) | ORAL | 0 refills | Status: AC

## 2022-11-04 NOTE — Telephone Encounter (Signed)
Caregiver calls nurse line reporting positive covid test this morning.   Symptoms started this morning with a runny nose, mild congestion and fevers. He reports 101 fever at 630am. He reports he gave him Tylenol. Temp now 99.5.  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   He denies any cough, headaches, nausea, vomiting, diarrhea or SOB.   They are interested in antiviral therapy.   Conservative measures given. ED precautions discussed.   Will forward to PCP.

## 2022-11-04 NOTE — Progress Notes (Signed)
[facility] Telemedicine Visit  Patient consented to have virtual visit and was identified by name and date of birth. Method of visit: Telephone  Encounter participants: Patient: Stanley [name] - located at home Provider: Maury Dus - located at [facility] Others (if applicable): Patient's Mom Sue Lush) and caregiver [facility])  Chief Complaint: COVID+  HPI:  Patient tested positive for COVID this morning. Symptoms, which include congestion/runny nose and fever, all started today. Fever to 101 but improved with Tylenol. Slight cough as well. No shortness of breath, chest pain, or GI symptoms. 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  ROS: per HPI  Pertinent PMHx: per HPI  Exam:  There were no vitals taken for this visit.  Respiratory: normal effort, speaking in full sentences, no audible coughing  Assessment/Plan:  COVID Mild in severity. Symptom onset this morning. However, he is higher risk due to history of osteogenesis imperfecta with limited respiratory reserve. Therefore, discussed antiviral options including Paxlovid vs Molnupiravir. Paxlovid has potential interactions with his amlodipine and abilify, therefore opted for molnupiravir. Rx sent. ED and return precautions reviewed.    Time spent during visit with patient: 20 minutes

## 2022-11-04 NOTE — Telephone Encounter (Signed)
Returned call to [place]. Scheduled mychart video visit at 1130.  Veronda Prude, RN

## 2022-11-11 ENCOUNTER — Other Ambulatory Visit: Payer: Self-pay

## 2022-11-11 MED ORDER — FEXOFENADINE HCL 180 MG PO TABS: 180.0000 mg | ORAL_TABLET | Freq: Every day | ORAL | 1 refills | Status: DC

## 2023-01-24 ENCOUNTER — Encounter: Payer: Self-pay | Admitting: Family Medicine

## 2023-01-24 NOTE — Progress Notes (Signed)
[employer] Access form completed and placed in the front office to be faxed.

## 2023-02-01 ENCOUNTER — Telehealth: Payer: Self-pay

## 2023-02-01 NOTE — Telephone Encounter (Signed)
Received call from Stanley [name] that [employer] forms have not been received.   Advised these were faxed last week.   Fax pulled and resent to [phone].

## 2023-02-08 ENCOUNTER — Other Ambulatory Visit: Payer: Self-pay | Admitting: Family Medicine

## 2023-03-28 ENCOUNTER — Telehealth: Payer: Self-pay | Admitting: Family Medicine

## 2023-03-28 NOTE — Telephone Encounter (Signed)
Need to move his appointment to 3:50 pm due to program retreat at the time of his earlier appointment.  He asked that I call Antonio at [phone]  HIPAA compliant callback message left for [facility]. Please let Antonio know that his appointment has been moved from 2:10 pm to 3:50 pm when he calls.

## 2023-04-05 ENCOUNTER — Other Ambulatory Visit: Payer: Self-pay | Admitting: Family Medicine

## 2023-04-06 ENCOUNTER — Encounter: Payer: Medicare Other | Admitting: Family Medicine

## 2023-04-11 ENCOUNTER — Encounter: Payer: Self-pay | Admitting: Family Medicine

## 2023-04-11 ENCOUNTER — Ambulatory Visit: Payer: Medicare Other | Admitting: Family Medicine

## 2023-04-11 VITALS — BP 101/70 | HR 62

## 2023-04-11 DIAGNOSIS — Z0000 Encounter for general adult medical examination without abnormal findings: Secondary | ICD-10-CM

## 2023-04-11 DIAGNOSIS — E785 Hyperlipidemia, unspecified: Secondary | ICD-10-CM

## 2023-04-11 DIAGNOSIS — F339 Major depressive disorder, recurrent, unspecified: Secondary | ICD-10-CM | POA: Diagnosis not present

## 2023-04-11 DIAGNOSIS — R7309 Other abnormal glucose: Secondary | ICD-10-CM | POA: Diagnosis not present

## 2023-04-11 DIAGNOSIS — I10 Essential (primary) hypertension: Secondary | ICD-10-CM

## 2023-04-11 DIAGNOSIS — Z23 Encounter for immunization: Secondary | ICD-10-CM | POA: Diagnosis not present

## 2023-04-11 DIAGNOSIS — F29 Unspecified psychosis not due to a substance or known physiological condition: Secondary | ICD-10-CM

## 2023-04-11 DIAGNOSIS — Q780 Osteogenesis imperfecta: Secondary | ICD-10-CM

## 2023-04-11 NOTE — Assessment & Plan Note (Signed)
Stable on meds Close follow up with Psych

## 2023-04-11 NOTE — Progress Notes (Signed)
    SUBJECTIVE:   Chief compliant/HPI: annual examination  Stanley [name] is a 47 y.o. who presents today for an annual exam.   History tabs reviewed and updated: Yes.   Review of systems form reviewed and notable for stable conditions.  MDD/Psychosis: He endorsed stability on his medications with good f/u with Psych  OBJECTIVE:   BP 101/70   Pulse 62   SpO2 100%   Physical Exam Vitals and nursing note reviewed.  Constitutional:      Appearance: Normal appearance.  HENT:     Head: Atraumatic.  Eyes:     Extraocular Movements: Extraocular movements intact.     Conjunctiva/sclera: Conjunctivae normal.     Pupils: Pupils are equal, round, and reactive to light.  Cardiovascular:     Rate and Rhythm: Normal rate and regular rhythm.     Pulses: Normal pulses.     Heart sounds: Normal heart sounds. No murmur heard. Pulmonary:     Effort: Pulmonary effort is normal. No respiratory distress.     Breath sounds: Normal breath sounds. No wheezing.  Abdominal:     General: Abdomen is flat. Bowel sounds are normal. There is no distension.     Palpations: Abdomen is soft. There is no mass.     Tenderness: There is no abdominal tenderness.  Musculoskeletal:     Cervical back: Neck supple.     Right lower leg: No edema.     Left lower leg: No edema.     Comments: Short limbs  Neurological:     General: No focal deficit present.     Mental Status: He is oriented to person, place, and time.  Psychiatric:        Behavior: Behavior normal.        Thought Content: Thought content normal.      ASSESSMENT/PLAN:   No problem-specific Assessment & Plan notes found for this encounter.    Annual Examination  See AVS for recommendations.  PHQ score  Flowsheet Row Office Visit from 04/11/2023 in [facility]  PHQ-9 Total Score 0      Blood pressure value is at goal.  Advanced directives Discussed   Considered the following screening exams based upon  USPSTF recommendations: Diabetes screening: discussed and ordered Screening for elevated cholesterol: discussed and ordered HIV testing: discussed Hepatitis C: discussed Hepatitis B: discussed Syphilis if at high risk: discussed and not at risk and he declined Reviewed risk factors for latent tuberculosis and not indicated Colorectal cancer screening: up to date on screening for CRC. if age 35 or over or risk factors.  Immunizations Influenza, COVID and PCV20 given today. Hx of tobacco use and abnormal PFT in the past.  Future lab ordered. He also requested CBC check  Follow up in 1 year or sooner if indicated.   NB: He did not wait to get lab today. I called him to discussed and he said he schedule a lab appointment.  His chronic conditions reviewed, discussed and were stable   [name], MD [name]

## 2023-04-11 NOTE — Patient Instructions (Signed)

## 2023-04-11 NOTE — Assessment & Plan Note (Signed)
Stable Future lab ordered He did not wait for lab today

## 2023-04-25 ENCOUNTER — Other Ambulatory Visit: Payer: Medicare Other

## 2023-04-26 ENCOUNTER — Other Ambulatory Visit: Payer: Medicare Other

## 2023-04-29 ENCOUNTER — Other Ambulatory Visit: Payer: Medicare Other

## 2023-05-02 ENCOUNTER — Other Ambulatory Visit: Payer: Medicare Other

## 2023-05-03 ENCOUNTER — Other Ambulatory Visit: Payer: Medicare Other

## 2023-05-03 DIAGNOSIS — Q780 Osteogenesis imperfecta: Secondary | ICD-10-CM

## 2023-05-03 DIAGNOSIS — E785 Hyperlipidemia, unspecified: Secondary | ICD-10-CM

## 2023-05-03 DIAGNOSIS — R7309 Other abnormal glucose: Secondary | ICD-10-CM

## 2023-05-03 DIAGNOSIS — I10 Essential (primary) hypertension: Secondary | ICD-10-CM

## 2023-05-05 ENCOUNTER — Other Ambulatory Visit: Payer: Medicare Other

## 2023-05-09 ENCOUNTER — Encounter: Payer: Self-pay | Admitting: Family Medicine

## 2023-05-09 NOTE — Progress Notes (Signed)
[employer] Practitioner Written Order Form for Wheelchair repair Form completed and placed in the front office fax box.

## 2023-05-15 ENCOUNTER — Encounter: Payer: Self-pay | Admitting: Family Medicine

## 2023-05-27 ENCOUNTER — Other Ambulatory Visit: Payer: Medicare Other

## 2023-05-30 ENCOUNTER — Other Ambulatory Visit: Payer: Medicare Other

## 2023-06-01 ENCOUNTER — Other Ambulatory Visit: Payer: Medicare Other

## 2023-06-07 ENCOUNTER — Other Ambulatory Visit (INDEPENDENT_AMBULATORY_CARE_PROVIDER_SITE_OTHER): Payer: Medicare Other

## 2023-06-07 DIAGNOSIS — R7309 Other abnormal glucose: Secondary | ICD-10-CM

## 2023-06-07 DIAGNOSIS — E785 Hyperlipidemia, unspecified: Secondary | ICD-10-CM

## 2023-06-07 DIAGNOSIS — Q780 Osteogenesis imperfecta: Secondary | ICD-10-CM

## 2023-06-07 DIAGNOSIS — I10 Essential (primary) hypertension: Secondary | ICD-10-CM

## 2023-06-07 LAB — POCT GLYCOSYLATED HEMOGLOBIN (HGB A1C): Hemoglobin A1C: 5.1 % (ref 4.0–5.6)

## 2023-06-08 ENCOUNTER — Encounter: Payer: Self-pay | Admitting: Family Medicine

## 2023-06-08 ENCOUNTER — Telehealth: Payer: Self-pay | Admitting: Family Medicine

## 2023-06-08 LAB — CMP14+EGFR
ALT: 28 [IU]/L (ref 0–44)
AST: 28 [IU]/L (ref 0–40)
Albumin: 4 g/dL — ABNORMAL LOW (ref 4.1–5.1)
Alkaline Phosphatase: 65 [IU]/L (ref 44–121)
BUN/Creatinine Ratio: 24 — ABNORMAL HIGH (ref 9–20)
BUN: 8 mg/dL (ref 6–24)
Bilirubin Total: 0.2 mg/dL (ref 0.0–1.2)
CO2: 25 mmol/L (ref 20–29)
Calcium: 9.1 mg/dL (ref 8.7–10.2)
Chloride: 102 mmol/L (ref 96–106)
Creatinine, Ser: 0.34 mg/dL — ABNORMAL LOW (ref 0.76–1.27)
Globulin, Total: 2.5 g/dL (ref 1.5–4.5)
Glucose: 78 mg/dL (ref 70–99)
Potassium: 4.2 mmol/L (ref 3.5–5.2)
Sodium: 140 mmol/L (ref 134–144)
Total Protein: 6.5 g/dL (ref 6.0–8.5)
eGFR: 142 mL/min/{1.73_m2} (ref >59)

## 2023-06-08 LAB — LIPID PANEL
Chol/HDL Ratio: 4.4 {ratio} (ref 0.0–5.0)
Cholesterol, Total: 199 mg/dL (ref 100–199)
HDL: 45 mg/dL (ref >39)
LDL Chol Calc (NIH): 144 mg/dL — ABNORMAL HIGH (ref 0–99)
Triglycerides: 57 mg/dL (ref 0–149)
VLDL Cholesterol Cal: 10 mg/dL (ref 5–40)

## 2023-06-08 LAB — CBC
Hematocrit: 43.2 % (ref 37.5–51.0)
Hemoglobin: 13.6 g/dL (ref 13.0–17.7)
MCH: 29.6 pg (ref 26.6–33.0)
MCHC: 31.5 g/dL (ref 31.5–35.7)
MCV: 94 fL (ref 79–97)
Platelets: 174 10*3/uL (ref 150–450)
RBC: 4.59 x10E6/uL (ref 4.14–5.80)
RDW: 12.3 % (ref 11.6–15.4)
WBC: 7.9 10*3/uL (ref 3.4–10.8)

## 2023-06-08 NOTE — Telephone Encounter (Signed)
Called to discuss his test result. He said he is at work now and to send him a Well[employer] geologist. I gave him a brief result summary. Overall, the lab looks good with improved LDL - no meds needed. 10 years ASCVD risk of 3%. Lifestyle modification is recommended. He was appreciative of the call.  Also, need to update his smoking history. I was unable to obtain it since he needed to get off the phone.

## 2023-06-09 ENCOUNTER — Encounter: Payer: Self-pay | Admitting: Family Medicine

## 2023-06-09 DIAGNOSIS — Z87891 Personal history of nicotine dependence: Secondary | ICD-10-CM | POA: Insufficient documentation

## 2023-06-20 ENCOUNTER — Telehealth: Payer: Self-pay

## 2023-06-20 NOTE — Telephone Encounter (Signed)
Jeffery with [employer] calls nurse line in regards to wheelchair repairs order.   He reports this has been faxed to our office multiple times without a reply since October.   I advised him we have faxed paperwork multiple times it appears.   He gave me an alternate fax number to use- [phone].  He reports he will be faxing over another duplicate as I do not see a copy scanned into his chart.   Please give signed forms to me for processing.

## 2023-06-20 NOTE — Telephone Encounter (Signed)
Will do when I receive the form

## 2023-06-20 NOTE — Telephone Encounter (Signed)
Will do!

## 2023-06-21 NOTE — Telephone Encounter (Signed)
Form faxed to [employer] at alternate fax number provided by Tinnie Gens.   Fax: [phone]  Copy at my desk in RN room.

## 2023-07-07 ENCOUNTER — Other Ambulatory Visit: Payer: Self-pay | Admitting: Family Medicine

## 2023-07-18 ENCOUNTER — Other Ambulatory Visit: Payer: Self-pay | Admitting: Family Medicine

## 2023-08-05 ENCOUNTER — Telehealth: Payer: Self-pay

## 2023-08-05 NOTE — Telephone Encounter (Signed)
Stanley [name] with [facility] calls nurse line requesting medication list.   She asks we fax this to [phone].  List printed and faxed as requested.

## 2023-08-08 ENCOUNTER — Other Ambulatory Visit: Payer: Self-pay | Admitting: Family Medicine

## 2023-08-08 ENCOUNTER — Telehealth: Payer: Self-pay

## 2023-08-08 MED ORDER — FERROUS SULFATE 325 (65 FE) MG PO TABS: 325.0000 mg | ORAL_TABLET | Freq: Every day | ORAL | 4 refills | Status: DC

## 2023-08-08 NOTE — Progress Notes (Signed)
I called patient to clarify his iron supplement. He said he thinks h takes it once daily. I advised his hemoglobin looks good and he can keep taking it once daily or every other day if having constipation. He asked that I call Antonio at [phone] to notify him of this information. I was unable to reach [place], but left an HIPAA compliant message.  He confirmed that Gaspar Cola is his Medical sales representative. I was unable to reach [place] as well. Callback message left.   Last iron refill was in 2015. I sent in refills to his pharmacy. May also pick up OTC.

## 2023-08-08 NOTE — Telephone Encounter (Signed)
Hello team,  Please contact Stanley [name]  and advise that patient takes ferrous sulphate one tab daily. May continue same or take every other day if constipated. Thanks.  Najiyah Paris

## 2023-08-08 NOTE — Telephone Encounter (Signed)
Gaspar Cola with [facility] requests clarification on Ferrous Sulfate.  She reports she is unsure if he takes once per day or BID.  Will forward to PCP for dosing/sig calcification.

## 2023-08-09 NOTE — Telephone Encounter (Signed)
Called patients nurse. No answer. LVM of message from provider. Aquilla Solian, CMA

## 2023-08-10 ENCOUNTER — Other Ambulatory Visit: Payer: Self-pay | Admitting: Family Medicine

## 2023-08-10 ENCOUNTER — Encounter: Payer: Self-pay | Admitting: Family Medicine

## 2023-08-11 NOTE — Telephone Encounter (Signed)
 Spoke with patient. Made appt for Mon. Feb10th at 9:30. Linnie Riches, CMA

## 2023-08-15 ENCOUNTER — Ambulatory Visit (INDEPENDENT_AMBULATORY_CARE_PROVIDER_SITE_OTHER): Payer: Medicare Other | Admitting: Family Medicine

## 2023-08-15 ENCOUNTER — Encounter: Payer: Self-pay | Admitting: Family Medicine

## 2023-08-15 VITALS — BP 110/79 | HR 75

## 2023-08-15 DIAGNOSIS — Z79899 Other long term (current) drug therapy: Secondary | ICD-10-CM

## 2023-08-15 NOTE — Patient Instructions (Signed)
  Comments                      []   Name Order Details  Start End Authorized By                     Outpatient Medications        []   acetaminophen  (TYLENOL  8 HOUR) 650 MG CR tablet Take 1 tablet (650 mg total) by mouth every 8 (eight) hours as needed for pain. Dispense: 30 tablet, Refills: 3 ordered  05/19/2018 -- Catheryn Cluck, MD       Patient not taking. Reported on 08/15/2023      []   amLODipine (NORVASC) 5 MG tablet TAKE ONE TABLET BY MOUTH DAILY Dispense: 90 tablet, Refills: 1 ordered  07/07/2023 -- Arn Lane, MD            []   ARIPiprazole  (ABILIFY ) 15 MG tablet Take 15 mg by mouth daily.  -- -- [provider]            []   Calcium  Carb-Cholecalciferol 500-5 MG-MCG TABS TAKE ONE TABLET BY MOUTH TWICE A DAY Dispense: 180 tablet, Refills: 2 ordered  09/21/2022 -- Arn Lane, MD            []   chlorhexidine  (PERIDEX ) 0.12 % solution SMARTSIG:By Mouth  10/08/2022 -- [provider]       Patient not taking. Reported on 08/15/2023      []   escitalopram  (LEXAPRO ) 20 MG tablet Take 20 mg by mouth daily.  -- -- [provider]            []   ferrous sulfate  325 (65 FE) MG tablet Take 1 tablet (325 mg total) by mouth daily with breakfast. May use every other day if constipated Dispense: 30 tablet, Refills: 4 ordered  08/08/2023 -- Arn Lane, MD            []   fexofenadine  (ALLEGRA ) 180 MG tablet TAKE 1 TABLET (180 MG TOTAL) BY MOUTH DAILY. Dispense: 90 tablet, Refills: 1 ordered  08/10/2023 -- Arn Lane, MD            []   methylphenidate  (CONCERTA ) 36 MG CR tablet Take 1 tablet (36 mg total) by mouth daily. Prescribed by [facility]  08/08/2012 -- Claudell Cruz, MD            []   metoprolol  tartrate (LOPRESSOR ) 25 MG tablet TAKE ONE TABLET BY MOUTH TWICE A DAY FOR BLOOD PRESSURE Dispense: 180 tablet, Refills: 1 ordered  07/18/2023 -- Arn Lane, MD            []    Multiple Vitamins-Minerals (MULTIVITAMIN MEN) TABS Take 1 tablet by mouth daily. Dispense: 30 tablet, Refills: Ordered: prn  01/20/2017 -- Arn Lane, MD            []   Omega-3 Fatty Acids (FISH OIL ) 1000 MG CAPS Take 1 capsule (1,000 mg total) by mouth in the morning and at bedtime. Dispense: 60 capsule, Refills: 11 ordered  01/10/2020 -- Arn Lane, MD            []   omeprazole  (PRILOSEC) 20 MG capsule TAKE ONE CAPSULE BY MOUTH TWICE A DAY AS NEEDED Dispense: 180 capsule, Refills: 1 ordered  04/05/2023 -- Arn Lane, MD       Patient not taking. Reported on 08/15/2023

## 2023-08-15 NOTE — Progress Notes (Signed)
    SUBJECTIVE:   CHIEF COMPLAINT / HPI:  No concerns. Need to reconcile medications. Here with mom and caretaker.   PERTINENT  PMH / PSH: PMhx reviewed  OBJECTIVE:   BP 110/79   Pulse 75   SpO2 100%   Physical Exam Vitals reviewed.  Constitutional:      Appearance: Normal appearance.  Cardiovascular:     Rate and Rhythm: Normal rate and regular rhythm.     Heart sounds: Normal heart sounds. No murmur heard. Pulmonary:     Effort: Pulmonary effort is normal. No respiratory distress.     Breath sounds: Normal breath sounds.      ASSESSMENT/PLAN:   No problem-specific Assessment & Plan notes found for this encounter.   Medication reviewed with him, caretaker and mom. I gave him an AVS copy of his med list and a copy for his mom per their request. F/U soon for health care. He was appreciative of this.   Penni Bowman, MD [facility]

## 2023-08-29 ENCOUNTER — Encounter (HOSPITAL_COMMUNITY): Payer: Self-pay | Admitting: Emergency Medicine

## 2023-08-29 ENCOUNTER — Emergency Department (HOSPITAL_COMMUNITY): Payer: Medicare Other

## 2023-08-29 ENCOUNTER — Inpatient Hospital Stay (HOSPITAL_COMMUNITY)
Admission: EM | Admit: 2023-08-29 | Discharge: 2023-09-03 | DRG: 208 | Disposition: E | Payer: Medicare Other | Attending: Family Medicine | Admitting: Family Medicine

## 2023-08-29 ENCOUNTER — Telehealth: Payer: Medicare Other | Admitting: Student

## 2023-08-29 ENCOUNTER — Other Ambulatory Visit: Payer: Self-pay

## 2023-08-29 ENCOUNTER — Encounter (INDEPENDENT_AMBULATORY_CARE_PROVIDER_SITE_OTHER): Payer: Medicare Other

## 2023-08-29 DIAGNOSIS — J150 Pneumonia due to Klebsiella pneumoniae: Secondary | ICD-10-CM | POA: Diagnosis present

## 2023-08-29 DIAGNOSIS — Z79899 Other long term (current) drug therapy: Secondary | ICD-10-CM

## 2023-08-29 DIAGNOSIS — K3189 Other diseases of stomach and duodenum: Secondary | ICD-10-CM | POA: Diagnosis present

## 2023-08-29 DIAGNOSIS — K920 Hematemesis: Secondary | ICD-10-CM | POA: Diagnosis present

## 2023-08-29 DIAGNOSIS — J189 Pneumonia, unspecified organism: Secondary | ICD-10-CM | POA: Diagnosis present

## 2023-08-29 DIAGNOSIS — K922 Gastrointestinal hemorrhage, unspecified: Secondary | ICD-10-CM | POA: Insufficient documentation

## 2023-08-29 DIAGNOSIS — D649 Anemia, unspecified: Secondary | ICD-10-CM

## 2023-08-29 DIAGNOSIS — D62 Acute posthemorrhagic anemia: Secondary | ICD-10-CM | POA: Diagnosis not present

## 2023-08-29 DIAGNOSIS — R578 Other shock: Secondary | ICD-10-CM | POA: Diagnosis present

## 2023-08-29 DIAGNOSIS — B349 Viral infection, unspecified: Secondary | ICD-10-CM | POA: Diagnosis present

## 2023-08-29 DIAGNOSIS — Z886 Allergy status to analgesic agent status: Secondary | ICD-10-CM

## 2023-08-29 DIAGNOSIS — I10 Essential (primary) hypertension: Secondary | ICD-10-CM | POA: Diagnosis present

## 2023-08-29 DIAGNOSIS — Z8711 Personal history of peptic ulcer disease: Secondary | ICD-10-CM

## 2023-08-29 DIAGNOSIS — Z66 Do not resuscitate: Secondary | ICD-10-CM | POA: Diagnosis not present

## 2023-08-29 DIAGNOSIS — Z515 Encounter for palliative care: Secondary | ICD-10-CM

## 2023-08-29 DIAGNOSIS — K219 Gastro-esophageal reflux disease without esophagitis: Secondary | ICD-10-CM | POA: Diagnosis present

## 2023-08-29 DIAGNOSIS — F32A Depression, unspecified: Secondary | ICD-10-CM | POA: Diagnosis present

## 2023-08-29 DIAGNOSIS — J9601 Acute respiratory failure with hypoxia: Secondary | ICD-10-CM | POA: Insufficient documentation

## 2023-08-29 DIAGNOSIS — Z87891 Personal history of nicotine dependence: Secondary | ICD-10-CM

## 2023-08-29 DIAGNOSIS — K921 Melena: Secondary | ICD-10-CM | POA: Diagnosis present

## 2023-08-29 DIAGNOSIS — Q780 Osteogenesis imperfecta: Secondary | ICD-10-CM

## 2023-08-29 DIAGNOSIS — R0602 Shortness of breath: Secondary | ICD-10-CM | POA: Diagnosis not present

## 2023-08-29 DIAGNOSIS — J690 Pneumonitis due to inhalation of food and vomit: Secondary | ICD-10-CM | POA: Diagnosis not present

## 2023-08-29 DIAGNOSIS — R0603 Acute respiratory distress: Secondary | ICD-10-CM | POA: Diagnosis present

## 2023-08-29 DIAGNOSIS — E785 Hyperlipidemia, unspecified: Secondary | ICD-10-CM | POA: Diagnosis present

## 2023-08-29 DIAGNOSIS — F845 Asperger's syndrome: Secondary | ICD-10-CM | POA: Diagnosis present

## 2023-08-29 LAB — RESP PANEL BY RT-PCR (RSV, FLU A&B, COVID)  RVPGX2
Influenza A by PCR: NEGATIVE
Influenza B by PCR: NEGATIVE
Resp Syncytial Virus by PCR: NEGATIVE
SARS Coronavirus 2 by RT PCR: NEGATIVE

## 2023-08-29 LAB — COMPREHENSIVE METABOLIC PANEL
ALT: 19 U/L (ref 0–44)
AST: 27 U/L (ref 15–41)
Albumin: 2.9 g/dL — ABNORMAL LOW (ref 3.5–5.0)
Alkaline Phosphatase: 45 U/L (ref 38–126)
Anion gap: 9 (ref 5–15)
BUN: 29 mg/dL — ABNORMAL HIGH (ref 6–20)
CO2: 23 mmol/L (ref 22–32)
Calcium: 8.6 mg/dL — ABNORMAL LOW (ref 8.9–10.3)
Chloride: 102 mmol/L (ref 98–111)
Creatinine, Ser: 0.36 mg/dL — ABNORMAL LOW (ref 0.61–1.24)
GFR, Estimated: >60 mL/min (ref >60)
Glucose, Bld: 139 mg/dL — ABNORMAL HIGH (ref 70–99)
Potassium: 4.3 mmol/L (ref 3.5–5.1)
Sodium: 134 mmol/L — ABNORMAL LOW (ref 135–145)
Total Bilirubin: 0.6 mg/dL (ref 0.0–1.2)
Total Protein: 6.2 g/dL — ABNORMAL LOW (ref 6.5–8.1)

## 2023-08-29 LAB — CBC
HCT: 45.5 % (ref 39.0–52.0)
Hemoglobin: 15.1 g/dL (ref 13.0–17.0)
MCH: 30.7 pg (ref 26.0–34.0)
MCHC: 33.2 g/dL (ref 30.0–36.0)
MCV: 92.5 fL (ref 80.0–100.0)
Platelets: 191 10*3/uL (ref 150–400)
RBC: 4.92 MIL/uL (ref 4.22–5.81)
RDW: 13.2 % (ref 11.5–15.5)
WBC: 7.4 10*3/uL (ref 4.0–10.5)
nRBC: 0 % (ref 0.0–0.2)

## 2023-08-29 MED ORDER — SODIUM CHLORIDE 0.9 % IV BOLUS
250.0000 mL | Freq: Once | INTRAVENOUS | Status: AC
Start: 2023-08-30 — End: 2023-08-30
  Administered 2023-08-30: 250 mL via INTRAVENOUS

## 2023-08-29 MED ORDER — PIPERACILLIN-TAZOBACTAM IN DEX 2-0.25 GM/50ML IV SOLN
2.2500 g | Freq: Once | INTRAVENOUS | Status: AC
  Administered 2023-08-30: 2.25 g via INTRAVENOUS
  Filled 2023-08-29: qty 50

## 2023-08-29 MED ORDER — PIPERACILLIN-TAZOBACTAM 3.375 G IVPB 30 MIN: 3.3750 g | Freq: Once | INTRAVENOUS | Status: DC

## 2023-08-29 MED ORDER — METHYLPREDNISOLONE SODIUM SUCC 125 MG IJ SOLR
60.0000 mg | INTRAMUSCULAR | Status: AC
  Administered 2023-08-29: 60 mg via INTRAVENOUS
  Filled 2023-08-29: qty 2

## 2023-08-29 MED ORDER — IPRATROPIUM-ALBUTEROL 0.5-2.5 (3) MG/3ML IN SOLN
3.0000 mL | Freq: Once | RESPIRATORY_TRACT | Status: AC
Start: 2023-08-29 — End: 2023-08-29
  Administered 2023-08-29: 3 mL via RESPIRATORY_TRACT
  Filled 2023-08-29: qty 3

## 2023-08-29 MED ORDER — METHYLPREDNISOLONE SODIUM SUCC 125 MG IJ SOLR: 125.0000 mg | INTRAMUSCULAR | Status: DC

## 2023-08-29 NOTE — ED Provider Triage Note (Signed)
 Emergency Medicine Provider Triage Evaluation Note  Shepherd Finnan , a 48 y.o. male  was evaluated in triage.  Pt complains of shortness of breath, wheezing, fevers at home beginning today.  Here with caregiver.  Caregiver reports the patient has had elevated temperatures at home, he received Tylenol this temperature went down.  He is audibly wheezing on examination.  He denies a history of asthma.  Denies chest pain but is endorsing shortness of breath.  Denies nausea, vomiting or diarrhea.  Charge nurse advised that the patient does need a room.  Review of Systems  Positive:  Negative:   Physical Exam  BP (!) 123/93 (BP Location: Right Leg)   Pulse 95   Temp 97.8 F (36.6 C)   Resp 20   Wt 27.4 kg   SpO2 90%   BMI 31.07 kg/m  Gen:   Awake, no distress   Resp:  Normal effort  MSK:   Moves extremities without difficulty  Other:    Medical Decision Making  Medically screening exam initiated at 9:52 PM.  Appropriate orders placed.  Hillard Ives-Rublee was informed that the remainder of the evaluation will be completed by another provider, this initial triage assessment does not replace that evaluation, and the importance of remaining in the ED until their evaluation is complete.     Al Decant, PA-C 08/29/23 2153

## 2023-08-29 NOTE — ED Notes (Signed)
 Pt had episode of dark emesis x 1 - continues to have labored breathing and expiratory wheezes

## 2023-08-29 NOTE — Progress Notes (Unsigned)
  [facility] Telemedicine Visit Patient was initially scheduled for an in-person evaluation, but his caregiver called the clinic and requested that visit be made virtual. Patient consented to have virtual visit and was identified by name and date of birth. Method of visit: Telephone. The limits of this medium of examination reviewed.   Encounter participants: Patient: Stanley [name] and caregiver Neldon Newport - located at   [address] [place] [address]   Provider: Eliezer Mccoy - located at [facility]  Chief Complaint: Vomiting, Fever, and Diarrhea  HPI: Symptoms started yesterday when they went out in town. Upon retuning home felt ill. Had some abdominal pain and non-bloody diarrhea. This morning woke up and had one episode of NBNB vomit. Tmax of 101. Vitals obtained over the phone with Temp 99.6 and BP 116/83.  He says he is feeling better now after a dose of Tylenol. Per caregiver [name], he has continued to eat and drink well this morning and is well-appearing. No respiratory distress by report  ROS: per HPI  Pertinent PMHx: Osteogenesis imperfecta, Asperger's, Depression, Peptic ulcer with history of GI bleed  Exam:  There were no vitals taken for this visit. Vitals taken by [facility] over the phone: BP 116/83, Temp 99.6 Patient able to speak and tell me that he feels better.   Assessment/Plan:  Assessment & Plan Viral syndrome Based on the description from [facility] and the patient, it sounds like this is a viral infection vs food borne infectious process. I anticipate this will have a self-resolving course. Advised to push fluids, monitor hydration status and return to care for failure to improve, respiratory distress, or failure to maintain PO intake.  - Supportive care as above  - Tylenol for fever. Avoid ibuprofen as able given history of PUD/GIB.  - Strict ER precautions for respiratory distress. He is high risk for respiratory decline  given his osteogenesis imperfecta and small stature    Time spent during visit with patient: 11 minutes

## 2023-08-29 NOTE — Progress Notes (Signed)
 This encounter was created in error - please disregard.

## 2023-08-29 NOTE — ED Triage Notes (Signed)
 Pt in with caregiver who states pt has been having sob, fever and emesis since yesterday. Pt has expiratory wheezes and sats 80% on RA - 2LNC applied at this time, now 96%. Also reports abdominal pain and dark stools today

## 2023-08-29 NOTE — Progress Notes (Signed)
 ED Pharmacy Antibiotic Sign Off An antibiotic consult was received from an ED provider for zosyn per pharmacy dosing for concern for aspiration pneumonia. A chart review was completed to assess appropriateness.   The following one time order(s) were placed:  Zosyn 2.25g  Further antibiotic and/or antibiotic pharmacy consults should be ordered by the admitting provider if indicated.   Thank you for allowing pharmacy to be a part of this patient's care.   Marja Kays, [facility]  Clinical Pharmacist 08/29/23 11:52 PM

## 2023-08-30 ENCOUNTER — Inpatient Hospital Stay (HOSPITAL_COMMUNITY): Payer: Medicare Other | Admitting: Anesthesiology

## 2023-08-30 ENCOUNTER — Inpatient Hospital Stay (HOSPITAL_COMMUNITY): Payer: Medicare Other

## 2023-08-30 DIAGNOSIS — F32A Depression, unspecified: Secondary | ICD-10-CM | POA: Diagnosis present

## 2023-08-30 DIAGNOSIS — K920 Hematemesis: Secondary | ICD-10-CM | POA: Diagnosis present

## 2023-08-30 DIAGNOSIS — R578 Other shock: Secondary | ICD-10-CM | POA: Diagnosis present

## 2023-08-30 DIAGNOSIS — K219 Gastro-esophageal reflux disease without esophagitis: Secondary | ICD-10-CM | POA: Diagnosis present

## 2023-08-30 DIAGNOSIS — R0603 Acute respiratory distress: Secondary | ICD-10-CM | POA: Diagnosis not present

## 2023-08-30 DIAGNOSIS — J9601 Acute respiratory failure with hypoxia: Secondary | ICD-10-CM

## 2023-08-30 DIAGNOSIS — J690 Pneumonitis due to inhalation of food and vomit: Principal | ICD-10-CM

## 2023-08-30 DIAGNOSIS — Z87891 Personal history of nicotine dependence: Secondary | ICD-10-CM | POA: Diagnosis not present

## 2023-08-30 DIAGNOSIS — Z8711 Personal history of peptic ulcer disease: Secondary | ICD-10-CM | POA: Diagnosis not present

## 2023-08-30 DIAGNOSIS — K279 Peptic ulcer, site unspecified, unspecified as acute or chronic, without hemorrhage or perforation: Secondary | ICD-10-CM

## 2023-08-30 DIAGNOSIS — K922 Gastrointestinal hemorrhage, unspecified: Secondary | ICD-10-CM | POA: Diagnosis not present

## 2023-08-30 DIAGNOSIS — Z66 Do not resuscitate: Secondary | ICD-10-CM | POA: Diagnosis not present

## 2023-08-30 DIAGNOSIS — D62 Acute posthemorrhagic anemia: Secondary | ICD-10-CM | POA: Diagnosis not present

## 2023-08-30 DIAGNOSIS — Z79899 Other long term (current) drug therapy: Secondary | ICD-10-CM | POA: Diagnosis not present

## 2023-08-30 DIAGNOSIS — I10 Essential (primary) hypertension: Secondary | ICD-10-CM | POA: Diagnosis present

## 2023-08-30 DIAGNOSIS — E785 Hyperlipidemia, unspecified: Secondary | ICD-10-CM | POA: Diagnosis present

## 2023-08-30 DIAGNOSIS — D649 Anemia, unspecified: Secondary | ICD-10-CM | POA: Diagnosis not present

## 2023-08-30 DIAGNOSIS — K3189 Other diseases of stomach and duodenum: Secondary | ICD-10-CM | POA: Diagnosis present

## 2023-08-30 DIAGNOSIS — R579 Shock, unspecified: Secondary | ICD-10-CM

## 2023-08-30 DIAGNOSIS — J150 Pneumonia due to Klebsiella pneumoniae: Secondary | ICD-10-CM | POA: Diagnosis present

## 2023-08-30 DIAGNOSIS — Q780 Osteogenesis imperfecta: Secondary | ICD-10-CM | POA: Diagnosis not present

## 2023-08-30 DIAGNOSIS — J189 Pneumonia, unspecified organism: Secondary | ICD-10-CM | POA: Diagnosis present

## 2023-08-30 DIAGNOSIS — Z515 Encounter for palliative care: Secondary | ICD-10-CM | POA: Diagnosis not present

## 2023-08-30 DIAGNOSIS — R0602 Shortness of breath: Secondary | ICD-10-CM | POA: Diagnosis present

## 2023-08-30 DIAGNOSIS — F845 Asperger's syndrome: Secondary | ICD-10-CM | POA: Diagnosis present

## 2023-08-30 DIAGNOSIS — K921 Melena: Secondary | ICD-10-CM | POA: Diagnosis present

## 2023-08-30 DIAGNOSIS — Z886 Allergy status to analgesic agent status: Secondary | ICD-10-CM | POA: Diagnosis not present

## 2023-08-30 LAB — BASIC METABOLIC PANEL
Anion gap: 6 (ref 5–15)
BUN: 42 mg/dL — ABNORMAL HIGH (ref 6–20)
CO2: 22 mmol/L (ref 22–32)
Calcium: 7.5 mg/dL — ABNORMAL LOW (ref 8.9–10.3)
Chloride: 108 mmol/L (ref 98–111)
Creatinine, Ser: 0.44 mg/dL — ABNORMAL LOW (ref 0.61–1.24)
GFR, Estimated: >60 mL/min (ref >60)
Glucose, Bld: 170 mg/dL — ABNORMAL HIGH (ref 70–99)
Potassium: 3.6 mmol/L (ref 3.5–5.1)
Sodium: 136 mmol/L (ref 135–145)

## 2023-08-30 LAB — I-STAT CHEM 8, ED
BUN: 27 mg/dL — ABNORMAL HIGH (ref 6–20)
Calcium, Ion: 1.11 mmol/L — ABNORMAL LOW (ref 1.15–1.40)
Chloride: 107 mmol/L (ref 98–111)
Creatinine, Ser: 0.3 mg/dL — ABNORMAL LOW (ref 0.61–1.24)
Glucose, Bld: 121 mg/dL — ABNORMAL HIGH (ref 70–99)
HCT: 38 % — ABNORMAL LOW (ref 39.0–52.0)
Hemoglobin: 12.9 g/dL — ABNORMAL LOW (ref 13.0–17.0)
Potassium: 3.7 mmol/L (ref 3.5–5.1)
Sodium: 138 mmol/L (ref 135–145)
TCO2: 22 mmol/L (ref 22–32)

## 2023-08-30 LAB — I-STAT VENOUS BLOOD GAS, ED
Acid-base deficit: 4 mmol/L — ABNORMAL HIGH (ref 0.0–2.0)
Bicarbonate: 21.6 mmol/L (ref 20.0–28.0)
Calcium, Ion: 1.12 mmol/L — ABNORMAL LOW (ref 1.15–1.40)
HCT: 38 % — ABNORMAL LOW (ref 39.0–52.0)
Hemoglobin: 12.9 g/dL — ABNORMAL LOW (ref 13.0–17.0)
O2 Saturation: 98 %
Potassium: 3.7 mmol/L (ref 3.5–5.1)
Sodium: 139 mmol/L (ref 135–145)
TCO2: 23 mmol/L (ref 22–32)
pCO2, Ven: 41.9 mmHg — ABNORMAL LOW (ref 44–60)
pH, Ven: 7.321 (ref 7.25–7.43)
pO2, Ven: 108 mmHg — ABNORMAL HIGH (ref 32–45)

## 2023-08-30 LAB — ABO/RH: ABO/RH(D): O POS

## 2023-08-30 LAB — CBC
HCT: 32.2 % — ABNORMAL LOW (ref 39.0–52.0)
Hemoglobin: 10.8 g/dL — ABNORMAL LOW (ref 13.0–17.0)
MCH: 30.9 pg (ref 26.0–34.0)
MCHC: 33.5 g/dL (ref 30.0–36.0)
MCV: 92.3 fL (ref 80.0–100.0)
Platelets: 144 10*3/uL — ABNORMAL LOW (ref 150–400)
RBC: 3.49 MIL/uL — ABNORMAL LOW (ref 4.22–5.81)
RDW: 13.3 % (ref 11.5–15.5)
WBC: 6 10*3/uL (ref 4.0–10.5)
nRBC: 0 % (ref 0.0–0.2)

## 2023-08-30 LAB — TYPE AND SCREEN
ABO/RH(D): O POS
Antibody Screen: NEGATIVE

## 2023-08-30 LAB — HIV ANTIBODY (ROUTINE TESTING W REFLEX): HIV Screen 4th Generation wRfx: NONREACTIVE

## 2023-08-30 LAB — POC OCCULT BLOOD, ED: Fecal Occult Bld: POSITIVE — AB

## 2023-08-30 LAB — MRSA NEXT GEN BY PCR, NASAL: MRSA by PCR Next Gen: NOT DETECTED

## 2023-08-30 LAB — I-STAT CG4 LACTIC ACID, ED: Lactic Acid, Venous: 1.6 mmol/L (ref 0.5–1.9)

## 2023-08-30 MED ORDER — FENTANYL CITRATE PF 50 MCG/ML IJ SOSY
50.0000 ug | PREFILLED_SYRINGE | INTRAMUSCULAR | Status: DC | PRN
  Administered 2023-08-30: 50 ug via INTRAVENOUS
  Filled 2023-08-30: qty 1

## 2023-08-30 MED ORDER — PANTOPRAZOLE SODIUM 40 MG IV SOLR
40.0000 mg | Freq: Two times a day (BID) | INTRAVENOUS | Status: DC
  Administered 2023-08-30: 40 mg via INTRAVENOUS
  Filled 2023-08-30: qty 10

## 2023-08-30 MED ORDER — LACTATED RINGERS IV BOLUS
250.0000 mL | Freq: Once | INTRAVENOUS | Status: AC
  Administered 2023-08-30: 250 mL via INTRAVENOUS

## 2023-08-30 MED ORDER — PROPOFOL 10 MG/ML IV BOLUS
INTRAVENOUS | Status: AC | PRN
  Administered 2023-08-30: 50 ug via INTRAVENOUS

## 2023-08-30 MED ORDER — ALBUTEROL SULFATE (2.5 MG/3ML) 0.083% IN NEBU
2.5000 mg | INHALATION_SOLUTION | RESPIRATORY_TRACT | Status: DC | PRN
  Administered 2023-08-30 – 2023-09-02 (×5): 2.5 mg via RESPIRATORY_TRACT
  Filled 2023-08-30 (×4): qty 3

## 2023-08-30 MED ORDER — PIPERACILLIN-TAZOBACTAM IN DEX 2-0.25 GM/50ML IV SOLN
2.2500 g | Freq: Three times a day (TID) | INTRAVENOUS | Status: DC
  Administered 2023-08-30: 2.25 g via INTRAVENOUS
  Filled 2023-08-30 (×2): qty 50

## 2023-08-30 MED ORDER — PANTOPRAZOLE SODIUM 40 MG IV SOLR
40.0000 mg | Freq: Once | INTRAVENOUS | Status: AC
  Administered 2023-08-30: 40 mg via INTRAVENOUS
  Filled 2023-08-30: qty 10

## 2023-08-30 MED ORDER — ORAL CARE MOUTH RINSE: 15.0000 mL | OROMUCOSAL | Status: DC

## 2023-08-30 MED ORDER — DOCUSATE SODIUM 50 MG/5ML PO LIQD
100.0000 mg | Freq: Two times a day (BID) | ORAL | Status: DC
Start: 2023-08-30 — End: 2023-08-31
  Filled 2023-08-30: qty 10

## 2023-08-30 MED ORDER — DEXMEDETOMIDINE HCL IN NACL 400 MCG/100ML IV SOLN
0.0000 ug/kg/h | INTRAVENOUS | Status: DC
  Administered 2023-08-30: 0.2 ug/kg/h via INTRAVENOUS
  Administered 2023-08-30: 0.4 ug/kg/h via INTRAVENOUS

## 2023-08-30 MED ORDER — FENTANYL CITRATE PF 50 MCG/ML IJ SOSY: 12.5000 ug | PREFILLED_SYRINGE | INTRAMUSCULAR | Status: DC | PRN

## 2023-08-30 MED ORDER — ACETAMINOPHEN 160 MG/5ML PO SOLN
325.0000 mg | Freq: Four times a day (QID) | ORAL | Status: DC | PRN
  Administered 2023-08-30: 450 mg
  Filled 2023-08-30: qty 20.3

## 2023-08-30 MED ORDER — ROCURONIUM BROMIDE 10 MG/ML (PF) SYRINGE: PREFILLED_SYRINGE | INTRAVENOUS | Status: AC | PRN

## 2023-08-30 MED ORDER — DOCUSATE SODIUM 100 MG PO CAPS: 100.0000 mg | ORAL_CAPSULE | Freq: Two times a day (BID) | ORAL | Status: DC | PRN

## 2023-08-30 MED ORDER — PHENYLEPHRINE 80 MCG/ML (10ML) SYRINGE FOR IV PUSH (FOR BLOOD PRESSURE SUPPORT)
PREFILLED_SYRINGE | INTRAVENOUS | Status: AC | PRN
  Administered 2023-08-30: 120 ug via INTRAVENOUS
  Administered 2023-08-30: 160 ug via INTRAVENOUS

## 2023-08-30 MED ORDER — PANTOPRAZOLE SODIUM 40 MG IV SOLR: 20.0000 mg | INTRAVENOUS | Status: DC

## 2023-08-30 MED ORDER — PIPERACILLIN-TAZOBACTAM IN DEX 2-0.25 GM/50ML IV SOLN
2.2500 g | Freq: Two times a day (BID) | INTRAVENOUS | Status: DC
  Administered 2023-08-30 – 2023-09-01 (×4): 2.25 g via INTRAVENOUS
  Filled 2023-08-30 (×5): qty 50

## 2023-08-30 MED ORDER — PHENYLEPHRINE HCL (PRESSORS) 10 MG/ML IV SOLN
0.1000 ug/kg/min | INTRAVENOUS | Status: DC
  Administered 2023-08-30 (×2): 2.75 ug/kg/min via INTRAVENOUS
  Administered 2023-08-30: 2.45 ug/kg/min via INTRAVENOUS
  Administered 2023-08-30: 2.15 ug/kg/min via INTRAVENOUS
  Administered 2023-08-31: 3.05 ug/kg/min via INTRAVENOUS
  Administered 2023-08-31: 2 ug/kg/min via INTRAVENOUS
  Administered 2023-08-31 (×3): 3.05 ug/kg/min via INTRAVENOUS
  Filled 2023-08-30 (×10): qty 1.5

## 2023-08-30 MED ORDER — ORAL CARE MOUTH RINSE
15.0000 mL | OROMUCOSAL | Status: DC
Start: 2023-08-30 — End: 2023-08-31
  Administered 2023-08-30 – 2023-08-31 (×14): 15 mL via OROMUCOSAL

## 2023-08-30 MED ORDER — ENOXAPARIN SODIUM 30 MG/0.3ML IJ SOSY: 30.0000 mg | PREFILLED_SYRINGE | INTRAMUSCULAR | Status: DC

## 2023-08-30 MED ORDER — ORAL CARE MOUTH RINSE: 15.0000 mL | OROMUCOSAL | Status: DC | PRN

## 2023-08-30 MED ORDER — PHENYLEPHRINE HCL (PRESSORS) 10 MG/ML IV SOLN
0.1000 ug/kg/min | INTRAVENOUS | Status: DC
  Administered 2023-08-30 (×2): 0.1 ug/kg/min via INTRAVENOUS
  Filled 2023-08-30 (×2): qty 1.5

## 2023-08-30 MED ORDER — FENTANYL CITRATE PF 50 MCG/ML IJ SOSY
PREFILLED_SYRINGE | INTRAMUSCULAR | Status: AC
Start: 2023-08-30 — End: 2023-08-30
  Filled 2023-08-30: qty 1

## 2023-08-30 MED ORDER — SODIUM CHLORIDE 0.9 % IV SOLN
INTRAVENOUS | Status: AC | PRN
  Administered 2023-08-30: 250 mL via INTRAVENOUS

## 2023-08-30 MED ORDER — ARIPIPRAZOLE 5 MG PO TABS
15.0000 mg | ORAL_TABLET | Freq: Every day | ORAL | Status: DC
  Administered 2023-08-30: 15 mg
  Filled 2023-08-30 (×2): qty 1

## 2023-08-30 MED ORDER — ONDANSETRON 4 MG PO TBDP
4.0000 mg | ORAL_TABLET | Freq: Once | ORAL | Status: DC
  Filled 2023-08-30: qty 1

## 2023-08-30 MED ORDER — FAMOTIDINE 20 MG PO TABS: 20.0000 mg | ORAL_TABLET | Freq: Two times a day (BID) | ORAL | Status: DC

## 2023-08-30 MED ORDER — SUCCINYLCHOLINE CHLORIDE 200 MG/10ML IV SOSY
PREFILLED_SYRINGE | INTRAVENOUS | Status: DC | PRN
  Administered 2023-08-30: 100 mg via INTRAVENOUS

## 2023-08-30 MED ORDER — PHENYLEPHRINE 80 MCG/ML (10ML) SYRINGE FOR IV PUSH (FOR BLOOD PRESSURE SUPPORT)
PREFILLED_SYRINGE | INTRAVENOUS | Status: AC | PRN
  Administered 2023-08-30: 160 ug via INTRAVENOUS

## 2023-08-30 MED ORDER — POLYETHYLENE GLYCOL 3350 17 G PO PACK: 17.0000 g | PACK | Freq: Every day | ORAL | Status: DC | PRN

## 2023-08-30 MED ORDER — PHENYLEPHRINE HCL-NACL 20-0.9 MG/250ML-% IV SOLN
0.0000 ug/min | INTRAVENOUS | Status: DC
Start: 2023-08-30 — End: 2023-08-30
  Administered 2023-08-30: 50 ug/min via INTRAVENOUS
  Filled 2023-08-30: qty 250

## 2023-08-30 MED ORDER — PANTOPRAZOLE SODIUM 40 MG IV SOLR
20.0000 mg | Freq: Two times a day (BID) | INTRAVENOUS | Status: DC
  Administered 2023-08-30 – 2023-09-01 (×5): 20 mg via INTRAVENOUS
  Filled 2023-08-30 (×5): qty 10

## 2023-08-30 MED ORDER — LACTATED RINGERS IV SOLN: INTRAVENOUS | Status: AC

## 2023-08-30 MED ORDER — ESCITALOPRAM OXALATE 10 MG PO TABS
20.0000 mg | ORAL_TABLET | Freq: Every day | ORAL | Status: DC
  Administered 2023-08-30: 20 mg
  Filled 2023-08-30: qty 2

## 2023-08-30 MED ORDER — DEXMEDETOMIDINE HCL IN NACL 400 MCG/100ML IV SOLN
0.0000 ug/kg/h | INTRAVENOUS | Status: DC
  Administered 2023-08-30: 0.4 ug/kg/h via INTRAVENOUS

## 2023-08-30 MED ORDER — HEPARIN SODIUM (PORCINE) 5000 UNIT/ML IJ SOLN: 5000.0000 [IU] | Freq: Two times a day (BID) | INTRAMUSCULAR | Status: DC

## 2023-08-30 MED ORDER — ONDANSETRON HCL 4 MG/2ML IJ SOLN
4.0000 mg | Freq: Once | INTRAMUSCULAR | Status: DC
  Filled 2023-08-30: qty 2

## 2023-08-30 MED ORDER — SUCCINYLCHOLINE CHLORIDE 20 MG/ML IJ SOLN
INTRAMUSCULAR | Status: AC | PRN
  Administered 2023-08-30: 100 mg via INTRAVENOUS

## 2023-08-30 MED ORDER — ALBUMIN HUMAN 25 % IV SOLN
12.5000 g | Freq: Once | INTRAVENOUS | Status: AC
  Administered 2023-08-30: 12.5 g via INTRAVENOUS
  Filled 2023-08-30: qty 50

## 2023-08-30 MED ORDER — ONDANSETRON HCL 4 MG/2ML IJ SOLN
4.0000 mg | Freq: Once | INTRAMUSCULAR | Status: AC
  Administered 2023-08-30: 4 mg via INTRAVENOUS

## 2023-08-30 MED ORDER — CHLORHEXIDINE GLUCONATE CLOTH 2 % EX PADS
6.0000 | MEDICATED_PAD | Freq: Every day | CUTANEOUS | Status: DC
  Administered 2023-08-30 – 2023-09-01 (×3): 6 via TOPICAL

## 2023-08-30 MED ORDER — POLYETHYLENE GLYCOL 3350 17 G PO PACK: 17.0000 g | PACK | Freq: Every day | ORAL | Status: DC

## 2023-08-30 NOTE — ED Provider Notes (Signed)
 [place] EMERGENCY DEPARTMENT AT [facility] Provider Note   CSN: [id number] Arrival date & time: 08/29/23  2038     History  Chief Complaint  Patient presents with   Wheezing   Emesis    Stanley [name] is a 48 y.o. male.  The history is provided by the patient, the EMS personnel and medical records.  Wheezing Emesis Stanley [name] is a 49 y.o. male who presents to the Emergency Department complaining of difficulty breathing, vomiting.  He presents to the emergency department for evaluation of symptoms that started yesterday.  He has been experiencing shortness of breath, subjective fevers since yesterday.  He has had numerous episodes of dark emesis as well as dark stool.  No chest pain, abdominal pain.  He lives at assisted living and has a caretaker.  Patient and father provide history.  He has a history of osteogenesis imperfecta.     Home Medications Prior to Admission medications   Medication Sig Start Date End Date Taking? Authorizing Provider  Acetylcysteine (NAC 600) 600 MG CAPS Take 1 capsule by mouth daily.   Yes [provider]  amLODipine (NORVASC) 5 MG tablet TAKE ONE TABLET BY MOUTH DAILY 07/07/23  Yes [name] T, MD  ARIPiprazole (ABILIFY) 15 MG tablet Take 15 mg by mouth daily.   Yes [provider]  Calcium Carb-Cholecalciferol 500-5 MG-MCG TABS TAKE ONE TABLET BY MOUTH TWICE A DAY 09/21/22  Yes Eniola, Kehinde T, MD  escitalopram (LEXAPRO) 20 MG tablet Take 20 mg by mouth daily.   Yes [provider]  ferrous sulfate 325 (65 FE) MG tablet Take 1 tablet (325 mg total) by mouth daily with breakfast. May use every other day if constipated 08/08/23  Yes Eniola, Kehinde T, MD  fexofenadine (ALLEGRA) 180 MG tablet TAKE 1 TABLET (180 MG TOTAL) BY MOUTH DAILY. 08/10/23  Yes Doreene Eland, MD  methylphenidate (CONCERTA) 36 MG CR tablet Take 1 tablet (36 mg total) by mouth daily. Prescribed by Manuela Neptune- Mental health  08/08/12  Yes Macy Mis, MD  metoprolol tartrate (LOPRESSOR) 25 MG tablet TAKE ONE TABLET BY MOUTH TWICE A DAY FOR BLOOD PRESSURE 07/18/23  Yes Doreene Eland, MD  Multiple Vitamins-Minerals (MULTIVITAMIN MEN) TABS Take 1 tablet by mouth daily. 01/20/17  Yes Doreene Eland, MD  Omega-3 Fatty Acids (FISH OIL) 1000 MG CAPS Take 1 capsule (1,000 mg total) by mouth in the morning and at bedtime. 01/10/20  Yes Doreene Eland, MD      Allergies    Aspirin and Nsaids    Review of Systems   Review of Systems  Respiratory:  Positive for wheezing.   Gastrointestinal:  Positive for vomiting.  All other systems reviewed and are negative.   Physical Exam Updated Vital Signs BP (!) 70/48   Pulse (!) 101   Temp 98.2 F (36.8 C) (Axillary)   Resp (!) 28   Wt 27.4 kg   SpO2 100%   BMI 31.07 kg/m  Physical Exam Vitals and nursing note reviewed.  Constitutional:      General: He is in acute distress.     Appearance: He is well-developed. He is ill-appearing.     Comments: Small stature  HENT:     Head: Normocephalic and atraumatic.  Cardiovascular:     Rate and Rhythm: Normal rate and regular rhythm.     Heart sounds: No murmur heard. Pulmonary:     Comments: Tachypneic.  Noisy breathing with transmitted upper  airway noises.  Lungs are clear bilaterally on auscultation.  No stridor.  Accessory muscle use Abdominal:     Palpations: Abdomen is soft.     Tenderness: There is no abdominal tenderness. There is no guarding or rebound.  Musculoskeletal:        General: No swelling or tenderness.  Skin:    General: Skin is warm and dry.  Neurological:     Mental Status: He is oriented to person, place, and time.  Psychiatric:        Behavior: Behavior normal.     ED Results / Procedures / Treatments   Labs (all labs ordered are listed, but only abnormal results are displayed) Labs Reviewed  COMPREHENSIVE METABOLIC PANEL - Abnormal; Notable for the following components:       Result Value   Sodium 134 (*)    Glucose, Bld 139 (*)    BUN 29 (*)    Creatinine, Ser 0.36 (*)    Calcium 8.6 (*)    Total Protein 6.2 (*)    Albumin 2.9 (*)    All other components within normal limits  I-STAT VENOUS BLOOD GAS, ED - Abnormal; Notable for the following components:   pCO2, Ven 41.9 (*)    pO2, Ven 108 (*)    Acid-base deficit 4.0 (*)    Calcium, Ion 1.12 (*)    HCT 38.0 (*)    Hemoglobin 12.9 (*)    All other components within normal limits  I-STAT CHEM 8, ED - Abnormal; Notable for the following components:   BUN 27 (*)    Creatinine, Ser 0.30 (*)    Glucose, Bld 121 (*)    Calcium, Ion 1.11 (*)    Hemoglobin 12.9 (*)    HCT 38.0 (*)    All other components within normal limits  POC OCCULT BLOOD, ED - Abnormal; Notable for the following components:   Fecal Occult Bld POSITIVE (*)    All other components within normal limits  RESP PANEL BY RT-PCR (RSV, FLU A&B, COVID)  RVPGX2  CULTURE, BLOOD (ROUTINE X 2)  CULTURE, BLOOD (ROUTINE X 2)  CBC  OCCULT BLOOD X 1 CARD TO LAB, STOOL  OCCULT BLOOD GASTRIC / DUODENUM (SPECIMEN CUP)  BLOOD GAS, VENOUS  HIV ANTIBODY (ROUTINE TESTING W REFLEX)  BASIC METABOLIC PANEL  I-STAT CG4 LACTIC ACID, ED  TYPE AND SCREEN  ABO/RH    EKG None  Radiology DG Chest Portable 1 View Result Date: 08/29/2023 CLINICAL DATA:  Shortness of breath, wheezing EXAM: PORTABLE CHEST 1 VIEW COMPARISON:  02/27/2011 FINDINGS: Severe bony/thoracic deformity compatible with OI. Heart and mediastinal contours within normal limits. Patchy right upper lobe and bilateral perihilar airspace opacities. No effusions. IMPRESSION: Patchy bilateral airspace opacities.  Cannot exclude pneumonia. Electronically Signed   By: Charlett Nose M.D.   On: 08/29/2023 23:16    Procedures Procedures   CRITICAL CARE Performed by: Tilden Fossa   Total critical care time: 35 minutes  Critical care time was exclusive of separately billable procedures and  treating other patients.  Critical care was necessary to treat or prevent imminent or life-threatening deterioration.  Critical care was time spent personally by me on the following activities: development of treatment plan with patient and/or surrogate as well as nursing, discussions with consultants, evaluation of patient's response to treatment, examination of patient, obtaining history from patient or surrogate, ordering and performing treatments and interventions, ordering and review of laboratory studies, ordering and review of radiographic studies, pulse oximetry and  re-evaluation of patient's condition.  Medications Ordered in ED Medications  albuterol (PROVENTIL) (2.5 MG/3ML) 0.083% nebulizer solution 2.5 mg (2.5 mg Nebulization Given 08/30/23 0206)  docusate sodium (COLACE) capsule 100 mg (has no administration in time range)  polyethylene glycol (MIRALAX / GLYCOLAX) packet 17 g (has no administration in time range)  famotidine (PEPCID) tablet 20 mg (has no administration in time range)  pantoprazole (PROTONIX) injection 40 mg (has no administration in time range)  propofol (DIPRIVAN) 10 mg/mL bolus/IV push (50 mcg Intravenous Given 08/30/23 0336)  PHENYLephrine 80 mcg/ml in normal saline Adult IV Push Syringe (For Blood Pressure Support) (160 mcg Intravenous Given 08/30/23 0341)  rocuronium (ZEMURON) injection ( Intravenous Canceled Entry 08/30/23 0337)  succinylcholine (ANECTINE) injection (100 mg Intravenous Given 08/30/23 0338)  docusate (COLACE) 50 MG/5ML liquid 100 mg (has no administration in time range)  polyethylene glycol (MIRALAX / GLYCOLAX) packet 17 g (has no administration in time range)  fentaNYL (SUBLIMAZE) injection 50 mcg (has no administration in time range)  dexmedetomidine (PRECEDEX) 400 MCG/100ML (4 mcg/mL) infusion (0.4 mcg/kg/hr  27.4 kg Intravenous New Bag/Given 08/30/23 0350)  heparin injection 5,000 Units (has no administration in time range)  0.9 %  sodium  chloride infusion (250 mLs Intravenous New Bag/Given 08/30/23 0356)  ipratropium-albuterol (DUONEB) 0.5-2.5 (3) MG/3ML nebulizer solution 3 mL (3 mLs Nebulization Given 08/29/23 2224)  methylPREDNISolone sodium succinate (SOLU-MEDROL) 125 mg/2 mL injection 60 mg (60 mg Intravenous Given 08/29/23 2241)  sodium chloride 0.9 % bolus 250 mL (0 mLs Intravenous Stopped 08/30/23 0205)  piperacillin-tazobactam (ZOSYN) IVPB 2.25 g (0 g Intravenous Stopped 08/30/23 0153)  pantoprazole (PROTONIX) injection 40 mg (40 mg Intravenous Given 08/30/23 0127)  ondansetron (ZOFRAN) injection 4 mg (4 mg Intravenous Given 08/30/23 0213)    ED Course/ Medical Decision Making/ A&P                                 Medical Decision Making Amount and/or Complexity of Data Reviewed Labs: ordered.  Risk Prescription drug management. Decision regarding hospitalization.   Patient with history of OI here for evaluation of shortness of breath, vomiting.  He has had dark vomit and stool.  Patient with tachypnea, accessory muscle use on evaluation but clear lungs.  He did receive albuterol prior to assessment, no significant change in how he feels per patient and family.  Will try heated high flow to see if this helps his respiratory effort.  CBC with normal hemoglobin, BMP with elevation in BUN.  Chest x-ray with possible infiltrates.  Given history of emesis we will treat empirically for possible aspiration pneumonia.  Medicine consulted for admission for ongoing care. Patient did develop an episode of emesis in the emergency department, this does appear to be coffee-ground, Gastroccult sent.  Will add Protonix.        Final Clinical Impression(s) / ED Diagnoses Final diagnoses:  Aspiration pneumonitis [facility])    Rx / DC Orders ED Discharge Orders     None         Tilden Fossa, MD 08/30/23 [phone]

## 2023-08-30 NOTE — Progress Notes (Addendum)
 1038- Patients MAP dropped below goal, Neo was switched to pediatric formulation, Dr. Denese Killings at bedside okay with triturating to equivalent dosing. Not following protocol due to patient condition at this time.   1110-Dr. Agarwala at bedside, Bolus and maintence fluids ordered. Increased max neo dose.  Neo titrated per protocol   1205- BP now within ordered parameters on current dose.   1220-Give another LR bolus per Rutherford Guys, PA

## 2023-08-30 NOTE — Code Documentation (Signed)
 ETT tube back to 19 at the lip

## 2023-08-30 NOTE — Progress Notes (Signed)
 1450- Rutherford Guys, [place] made aware of lower resting HR (SB 55-60) while on precedex. Precedex was stopped at this time. Still having lower than target SBP and MAPS despite titration of Neo. Celine Mans stated new MAP goal >60.

## 2023-08-30 NOTE — Progress Notes (Signed)
 Pt transported from ED room 31 to 4N32 without any complications.

## 2023-08-30 NOTE — Procedures (Signed)
  Central Venous Catheter Insertion Procedure Note  Stanley [name] 1975/07/18     Date: 08/30/23 Time: 1610RU   Provider Performing: Madaline Brilliant DO   Procedure: Insertion of Non-tunneled Central Venous [id number]) with US guidance ([id number])    Indication(s) Medication administration and Difficult access   Consent Risks of the procedure as well as the alternatives and risks of each were explained to the patient and/or caregiver.  Consent for the procedure was obtained and is signed in the bedside chart   Anesthesia Topical only with 1% lidocaine    Timeout Verified patient identification, verified procedure, site/side was marked, verified correct patient position, special equipment/implants available, medications/allergies/relevant history reviewed, required imaging and test results available.   Sterile Technique Maximal sterile technique including full sterile barrier drape, hand hygiene, sterile gown, sterile gloves, mask, hair covering, sterile ultrasound probe cover (if used).   Procedure Description Area of catheter insertion was cleaned with chlorhexidine and draped in sterile fashion.  With real-time ultrasound guidance a central venous catheter was placed into the LEFT FEMORAL vein. Nonpulsatile blood flow and easy flushing noted in all ports.  The catheter was sutured in place and sterile dressing applied.  Complications/Tolerance None; patient tolerated the procedure well. Chest X-ray is ordered to verify placement for internal jugular or subclavian cannulation.   Chest x-ray is not ordered for femoral cannulation.   EBL Minimal   Specimen(s) None    Stanley Oliff DO [facility]    Please see [email or web address] for pager details.   From 7A-7P if no response, please call [phone] After hours, please call ELink [phone]

## 2023-08-30 NOTE — Progress Notes (Signed)
 NAME:  Stanley [name], MRN:  [id number], DOB:  May 27, 1976, LOS: 0 ADMISSION DATE:  08/29/2023, CONSULTATION DATE:  08/30/2023 REFERRING MD:  Gasper Lloyd CHIEF COMPLAINT:  SOB   History of Present Illness:  48 year old gentleman presents to the ED with complaints of shortness of breath, fever and emesis x 2-3 days.  Patient has had abdominal pain as well as dark stools.  Patient presents with his father stating that he has been wheezing with discomfort.  Per family medicine patient has been evaluated for upper GI bleed with a 3-day history of dark stools as well as low-grade fevers that have been responsive to Tylenol.  Patient lives in assisted living with caretaker.  In the ED patient was seen significant labored breathing with audible expiratory wheeze with respiratory rate of 24, currently on high flow nasal cannula O2 saturation is 96%.  There is noticeable accessory muscle abdominal usage.  Nurse reports 3 episodes of emesis thus far, she is in the process of administering Zosyn IV and a nebulizer treatment.  PCCM called to assess current respiratory needs.  Patient is currently on high flow nasal cannula 50/35, however secondary to concern for aspiration we are recommending endotracheal intubation as BiPAP may increased risk for aspiration considering current emesis.    Pt subsequently intubated by anesthesia 353AM 2/25.   Per mother and father, he has had PUD in the past requiring blood transfusions at [facility] though last occurrence was probably 15ish years ago. He has not had any problems since then.   Pertinent  Medical History  Osteogenesis imperfecta, benign hypertension, peptic ulcer, major depressive disorder  Significant Hospital Events: Including procedures, antibiotic start and stop dates in addition to other pertinent events   Respiratory Distress with questionable aspiration Emesis  Interim History / Subjective:  On neo, MAPs ~ 60. Bilious emesis from OGT as well as  ETT Weaning well on vent on 0.2 Precedex  Objective   Blood pressure (!) 80/48, pulse 93, temperature 98.2 F (36.8 C), temperature source Axillary, resp. rate (!) 25, height 3\' 1"  (0.94 m), weight 30.2 kg, SpO2 96%.    Vent Mode: PRVC FiO2 (%):  [35 %-100 %] 40 % Set Rate:  [20 bmp-22 bmp] 22 bmp Vt Set:  [320 mL] 320 mL PEEP:  [5 cmH20] 5 cmH20 Plateau Pressure:  [22 cmH20-27 cmH20] 22 cmH20   Intake/Output Summary (Last 24 hours) at 08/30/2023 1153 Last data filed at 08/30/2023 1051 Gross per 24 hour  Intake 1310.72 ml  Output 100 ml  Net 1210.72 ml   Filed Weights   08/29/23 2133 08/30/23 0500  Weight: 27.4 kg 30.2 kg    Physical exam General: Adult male with osteogenesis imperfecta, in NAD. Neuro: Awake on vent, follows basic commands. HEENT: [place]/AT. Sclerae anicteric. ETT in place. Cardiovascular: RRR, no M/R/G.  Lungs: Respirations even and unlabored.  CTA bilaterally, No W/R/R. Abdomen: BS x 4, soft, NT/ND.  Musculoskeletal: Deformities of UE and LE's 2/2 osteogenesis imperfecta. no edema.  Skin: Intact, warm, no rashes.    Assessment & Plan:   Acute hypoxic respiratory failure requiring intubation - 2/2 aspiration PNA. Aspiration PNA. - Continue full vent support with SBT as able (tolerating SBT well this AM). - Given ongoing suctioning of bilious material from ETT, will defer extubation consideration today and revisit tomorrow. - Continue Zosyn. - Add trach aspirate. - Bronchial hygiene. - CXR intermittently.  Heme positive stools and emesis - of note, has hx of PUD that have not been intervened  on due to body habitus per mother/father and seems to be controlled on Omeprazole. - PPI (peds dosing due to weight). - Hold on GI consult for now as output seems to have stopped/resolved and Hgb is stable at 10.8. - Monitor output. - Continue Zofran.  Shock - 2/2 aspiration but also complicated by positive pressure from vent with small body habitus. - Continue  neo for goal MAP > 65 (weight based dosing). - 250cc bolus now, can repeat if needed then maintenance at 50/hr. - SBT in AM for hopeful extubation.  Hx HTN. - Hold PTA AMlodipine, Lopressor.  Hx Aspergers, Depression, Osteogenesis Imperfecta, Psychosis. - Continue PTA Aripiprazole, Escitalopram. - Hold PTA Methylphenidate.   Best Practice (right click and "Reselect all SmartList Selections" daily)   Diet/type: NPO DVT prophylaxis other - no chemoprophylaxis given heme positive stools and no SCD's due to body habitus. Pressure ulcer(s): pressure ulcer assessment deferred  GI prophylaxis: PPI Lines: N/A peripheral IV x 2 Foley:  N/A Code Status:  full code Last date of multidisciplinary goals of care discussion: Discussed code status with mother and father 2/25. Strongly recommended DNR in the event of arrest due to concern for causing more harm then good with complications such as rib fx, internal bleeding etc (due to body habitus and hx osteogenesis imperfecta). Mother and father state that pt has informed them of wishes for resuscitation in the past; however, he might not be aware of what that entails. They are going to discuss things further and will also have a conversation with pt once extubated. They are in agreement to NOT perform CPR but would like to discuss with pt prior to changing code status officially.  Critical care time: 30 min.    Rutherford Guys, PA - [facility] For pager details, please see AMION or use Epic chat  After 1900, please call [facility] for cross coverage needs 08/30/2023, 12:10 PM

## 2023-08-30 NOTE — Assessment & Plan Note (Signed)
 Versus pneumonia.  Patient mental status waxing and waning throughout conversation.  Patient demonstrates worrisome increased work of breathing on HFNC - subcostal and intercostal retractions, belly breathing.  Patient is full code and amenable to chest compression and intubation - medical guardian (father) agrees. - Admitted to [facility] Medicine Teaching Service (Dr. Lloyd Huger, attending) - Have reached out to CCM for evaluation and assessment of respiratory needs. - Stat VBG ordered. - S/p IV Zofran 4 mg x1.

## 2023-08-30 NOTE — H&P (Addendum)
 NAME:  Stanley [name], MRN:  [id number], DOB:  Nov 10, 1975, LOS: 0 ADMISSION DATE:  08/29/2023, CONSULTATION DATE:  08/30/2023 REFERRING MD:  Gasper Lloyd CHIEF COMPLAINT:  SOB   History of Present Illness:  48 year old gentleman presents to the ED with complaints of shortness of breath, fever and emesis x 2-3 days.  Patient has had abdominal pain as well as dark stools.  Patient presents with his father stating that he has been wheezing with discomfort.  Per family medicine patient has been evaluated for upper GI bleed with a 3-day history of dark stools as well as low-grade fevers that have been responsive to Tylenol.  Patient lives in assisted living with caretaker.  In the ED patient was seen significant labored breathing with audible expiratory wheeze with respiratory rate of 24, currently on high flow nasal cannula O2 saturation is 96%.  There is noticeable accessory muscle abdominal usage.  Nurse reports 3 episodes of emesis thus far, she is in the process of administering Zosyn IV and a nebulizer treatment.  PCCM called to assess current respiratory needs.  Patient is currently on high flow nasal cannula 50/35, however secondary to concern for aspiration we are recommending endotracheal intubation as BiPAP may increased risk for aspiration considering current emesis.    EXAM: PORTABLE CHEST 1 VIEW   COMPARISON:  02/27/2011   FINDINGS: Severe bony/thoracic deformity compatible with OI. Heart and mediastinal contours within normal limits. Patchy right upper lobe and bilateral perihilar airspace opacities. No effusions.   IMPRESSION: Patchy bilateral airspace opacities.  Cannot exclude pneumonia.  PCCM attending Dr. Allena Katz is not on campus at this time, he is attending to a patient at [facility], therefore requesting anesthesia assistance for difficult intubation (body habitus).  Patient will be transferred to the pulmonary critical care service.  Detailed conversation  entertained with Dr. Phineas Real (family med resident) regarding status.  Pertinent  Medical History  Osteogenesis imperfecta, benign hypertension, peptic ulcer, major depressive disorder  Significant Hospital Events: Including procedures, antibiotic start and stop dates in addition to other pertinent events   Respiratory Distress with questionable aspiration Emesis  Interim History / Subjective:  48 year old male with complaints of shortness of breath and associated vomitus.  Objective   Blood pressure 96/65, pulse (!) 107, temperature 98.2 F (36.8 C), temperature source Axillary, resp. rate (!) 23, weight 27.4 kg, SpO2 98%.    FiO2 (%):  [35 %] 35 %  No intake or output data in the 24 hours ending 08/30/23 0223 Filed Weights   08/29/23 2133  Weight: 27.4 kg    Physical exam General: alert and oriented, visual respiratory distress on HFNC Lungs: coarse with audible wheeze, increased respiratory effort. Intercostal muscle use Cardiac: S1 S2, no murmer, rub or gallop. +/- Extremity edema Abdomen: Soft, Non tender, + Bowel Sounds, no guarding, non distended Skin: warm, good turgor Neuro: intact GU: nml  Resolved Hospital Problem list     Assessment & Plan:  Respiratory distress  Endotracheal intubation (airway protection)  Admission orders   Vent management  Initiate prophylactic antibiotic Zosyn Emesis  IV Zofran prn Dark emesis/stool  Monitor CBC  Consult GI  Continue family medicine GI workup Fever  Monitor  Antipyretics prn   Best Practice (right click and "Reselect all SmartList Selections" daily)   Diet/type: NPO DVT prophylaxis LMWH Pressure ulcer(s): pressure ulcer assessment deferred  GI prophylaxis: iv PPI Lines: N/A peripheral IV x 2 Foley:  N/A Code Status:  full code Last date of multidisciplinary  goals of care discussion []   Labs   CBC: Recent Labs  Lab 08/29/23 2220 08/30/23 0030 08/30/23 0031  WBC 7.4  --   --   HGB 15.1 12.9* 12.9*   HCT 45.5 38.0* 38.0*  MCV 92.5  --   --   PLT 191  --   --     Basic Metabolic Panel: Recent Labs  Lab 08/29/23 2220 08/30/23 0030 08/30/23 0031  NA 134* 139 138  K 4.3 3.7 3.7  CL 102  --  107  CO2 23  --   --   GLUCOSE 139*  --  121*  BUN 29*  --  27*  CREATININE 0.36*  --  0.30*  CALCIUM 8.6*  --   --    GFR: CrCl cannot be calculated (Unknown ideal weight.). Recent Labs  Lab 08/29/23 2220 08/30/23 0035  WBC 7.4  --   LATICACIDVEN  --  1.6    Liver Function Tests: Recent Labs  Lab 08/29/23 2220  AST 27  ALT 19  ALKPHOS 45  BILITOT 0.6  PROT 6.2*  ALBUMIN 2.9*   No results for input(s): "LIPASE", "AMYLASE" in the last 168 hours. No results for input(s): "AMMONIA" in the last 168 hours.  ABG    Component Value Date/Time   HCO3 21.6 08/30/2023 0030   TCO2 22 08/30/2023 0031   ACIDBASEDEF 4.0 (H) 08/30/2023 0030   O2SAT 98 08/30/2023 0030     Coagulation Profile: No results for input(s): "INR", "PROTIME" in the last 168 hours.  Cardiac Enzymes: No results for input(s): "CKTOTAL", "CKMB", "CKMBINDEX", "TROPONINI" in the last 168 hours.  HbA1C: Hemoglobin A1C  Date/Time Value Ref Range Status  06/07/2023 03:15 PM 5.1 4.0 - 5.6 % Final  10/23/2019 10:35 AM 5.1 4.0 - 5.6 % Final    CBG: No results for input(s): "GLUCAP" in the last 168 hours.  Review of Systems:   10 point system review is significant to chief complaint  Past Medical History:  He,  has a past medical history of Abnormal PFTs, Allergic rhinitis, Allergic rhinitis (09/01/2006), Asperger's disorder, Depression, Fecal urgency (05/14/2010), Fracture (01/12/2012), GERD (gastroesophageal reflux disease), Hiatal hernia, Hypertension, Iron deficiency anemia, Osteogenesis imperfecta, Other urinary incontinence (07/03/2007), Peptic ulcer, Polydipsia (04/11/2018), Psychosis (HCC), Seborrheic dermatitis, and Seborrheic dermatitis (01/13/2016).   Surgical History:   Past Surgical History:   Procedure Laterality Date   DISTRACTION OSTEOGENESIS     22 surgeries for osetogenesis imperfecta per patient-rods placed     Social History:   reports that he has quit smoking. His smoking use included cigarettes. He started smoking about 13 years ago. He has a 2 pack-year smoking history. He has never used smokeless tobacco. He reports current alcohol use. He reports that he does not use drugs.   Family History:  His family history is not on file. He was adopted.   Allergies Allergies  Allergen Reactions   Aspirin Other (See Comments)    History of GI bleed   Nsaids Other (See Comments)    History of GI bleed     Home Medications  Prior to Admission medications   Medication Sig Start Date End Date Taking? Authorizing Provider  acetaminophen (TYLENOL 8 HOUR) 650 MG CR tablet Take 1 tablet (650 mg total) by mouth every 8 (eight) hours as needed for pain. Patient not taking: Reported on 08/15/2023 05/19/18   Garnette Gunner, MD  Acetylcysteine (NAC 600) 600 MG CAPS Take 1 capsule by mouth daily.  [provider]  amLODipine (NORVASC) 5 MG tablet TAKE ONE TABLET BY MOUTH DAILY 07/07/23   [name] T, MD  ARIPiprazole (ABILIFY) 15 MG tablet Take 15 mg by mouth daily.    [provider]  Calcium Carb-Cholecalciferol 500-5 MG-MCG TABS TAKE ONE TABLET BY MOUTH TWICE A DAY 09/21/22   Doreene Eland, MD  chlorhexidine (PERIDEX) 0.12 % solution SMARTSIG:By Mouth Patient not taking: Reported on 08/15/2023 10/08/22   [provider]  escitalopram (LEXAPRO) 20 MG tablet Take 20 mg by mouth daily.    [provider]  ferrous sulfate 325 (65 FE) MG tablet Take 1 tablet (325 mg total) by mouth daily with breakfast. May use every other day if constipated 08/08/23   [name] T, MD  fexofenadine (ALLEGRA) 180 MG tablet TAKE 1 TABLET (180 MG TOTAL) BY MOUTH DAILY. 08/10/23   Doreene Eland, MD  methylphenidate (CONCERTA) 36 MG CR tablet Take 1 tablet  (36 mg total) by mouth daily. Prescribed by Manuela Neptune- Mental health 08/08/12   Macy Mis, MD  metoprolol tartrate (LOPRESSOR) 25 MG tablet TAKE ONE TABLET BY MOUTH TWICE A DAY FOR BLOOD PRESSURE 07/18/23   Doreene Eland, MD  Multiple Vitamins-Minerals (MULTIVITAMIN MEN) TABS Take 1 tablet by mouth daily. 01/20/17   Doreene Eland, MD  Omega-3 Fatty Acids (FISH OIL) 1000 MG CAPS Take 1 capsule (1,000 mg total) by mouth in the morning and at bedtime. 01/10/20   Doreene Eland, MD  omeprazole (PRILOSEC) 20 MG capsule TAKE ONE CAPSULE BY MOUTH TWICE A DAY AS NEEDED Patient not taking: Reported on 08/15/2023 04/05/23   Doreene Eland, MD     Critical care time:  Critical care time 30 minutes  --  Attending note: I have seen and examined the patient. History, labs and imaging reviewed.  Blood pressure (!) 87/63, pulse 97, temperature 97.8 F (36.6 C), temperature source Axillary, resp. rate (!) 22, height 3\' 1"  (0.94 m), weight 66 lb 9.3 oz (30.2 kg), SpO2 99%. Gen:      No acute distress HEENT:  EOMI, sclera anicteric Neck:     No masses; no thyromegaly Lungs:    Clear to auscultation bilaterally; normal respiratory effort CV:         Regular rate and rhythm; no murmurs Abd:      + bowel sounds; soft, non-tender; no palpable masses, no distension Ext:    No edema; adequate peripheral perfusion Skin:      Warm and dry; no rash Neuro: alert and oriented x 3 Psych: normal mood and affect   Labs/Imaging personally reviewed, significant for   Assessment/plan:  Osteogenisis imperfecta  - resume home meds as tolerated  Acute hypoxic respiratory faillure - concern for aspiration pneumonia - iv zosyn - vent mgmt, routine ABG/CXR -patient at high risk for trach  Melena - f/u GI eval  D/w family Patient at risk of trach, prognosis poor.  The patient is critically ill with multiple organ systems failure and requires high complexity decision making for assessment and support,  frequent evaluation and titration of therapies, application of advanced monitoring technologies and extensive interpretation of multiple databases.  Critical care time - 35 mins. This represents my time independent of the NPs time taking care of the pt.  Krisi Azua DO 08/30/2023, 6:53 AM

## 2023-08-30 NOTE — Progress Notes (Signed)
 Pharmacy Antibiotic Note  Stanley [name] is a 48 y.o. male admitted on 08/29/2023 with concern for aspiration pneumonia. PMH significant for osteogenesis imperfecta. Intubated in ED by anesthesiology. Pharmacy has been consulted for zosyn dosing.  Plan: Zosyn 2.25g q8h  F/u renal function, infectious work up and length of therapy  Weight: 27.4 kg (60 lb 8 oz)  Temp (24hrs), Avg:98.5 F (36.9 C), Min:97.8 F (36.6 C), Max:99.6 F (37.6 C)  Recent Labs  Lab 08/29/23 2220 08/30/23 0031 08/30/23 0035  WBC 7.4  --   --   CREATININE 0.36* 0.30*  --   LATICACIDVEN  --   --  1.6    CrCl cannot be calculated (Unknown ideal weight.).    Allergies  Allergen Reactions   Aspirin Other (See Comments)    History of GI bleed   Nsaids Other (See Comments)    History of GI bleed    Antimicrobials this admission: Zosyn 2/25 >  Microbiology results: 2/24 Bcx:  2/24 resp pcr: neg  Thank you for allowing pharmacy to be a part of this patient's care.  Marja Kays 08/30/2023 4:08 AM

## 2023-08-30 NOTE — Progress Notes (Signed)
 Pharmacy Antibiotic Note  Stanley [name] is a 48 y.o. male admitted on 08/29/2023 with concern for aspiration pneumonia. PMH significant for osteogenesis imperfecta. Intubated in ED by anesthesiology. Pharmacy has been consulted for zosyn dosing.  CrCL < 20 ml/min  Plan: Zosyn 2.25g IV Q12H, 30 min infusion F/u renal function, infectious work up and length of therapy  Height: 3\' 1"  (94 cm) Weight: 30.2 kg (66 lb 9.3 oz) IBW/kg (Calculated) : -2.9  Temp (24hrs), Avg:98 F (36.7 C), Min:97.8 F (36.6 C), Max:98.2 F (36.8 C)  Recent Labs  Lab 08/29/23 2220 08/30/23 0031 08/30/23 0035 08/30/23 0538 08/30/23 0928  WBC 7.4  --   --   --  6.0  CREATININE 0.36* 0.30*  --  0.44*  --   LATICACIDVEN  --   --  1.6  --   --     Estimated Creatinine Clearance: 16.6 mL/min (A) (by C-G formula based on SCr of 0.44 mg/dL (L)).    Allergies  Allergen Reactions   Aspirin Other (See Comments)    History of GI bleed   Nsaids Other (See Comments)    History of GI bleed    Antimicrobials this admission: Zosyn 2/25 >  Microbiology results: 2/24 Bcx:  2/24 resp pcr: neg  Alandis Bluemel D. Laney Potash, PharmD, BCPS, BCCCP 08/30/2023, 12:09 PM

## 2023-08-30 NOTE — Anesthesia Procedure Notes (Signed)
 Procedure Name: Intubation Date/Time: 08/30/2023 3:36 AM  Performed by: Laruth Bouchard., CRNAPre-anesthesia Checklist: Suction available, Emergency Drugs available, Patient identified and Patient being monitored Patient Re-evaluated:Patient Re-evaluated prior to induction Oxygen Delivery Method: Circle system utilized Preoxygenation: Pre-oxygenation with 100% oxygen Induction Type: IV induction Ventilation: Mask ventilation without difficulty Laryngoscope Size: Glidescope and 3 Grade View: Grade I Tube type: Oral Tube size: 7.0 mm Number of attempts: 1 Airway Equipment and Method: Video-laryngoscopy and Rigid stylet Placement Confirmation: ETT inserted through vocal cords under direct vision, CO2 detector and breath sounds checked- equal and bilateral Secured at: 21 cm Tube secured with: Tape Dental Injury: Teeth and Oropharynx as per pre-operative assessment  Difficulty Due To: Difficulty was anticipated, Difficult Airway- due to reduced neck mobility and Difficult Airway-  due to neck instability Future Recommendations: Recommend- induction with short-acting agent, and alternative techniques readily available

## 2023-08-30 NOTE — Plan of Care (Addendum)
 FMTS Interim Progress Note  S: Initially called for admission for patient due to worsening respiratory status with likely aspiration pneumonia questionably in the setting of upper GI bleed.  On interview with patient in the emergency room with father (medical guardian) present, reported that patient had had mild shortness of breath and initially normal but increasingly dark emesis starting this morning. Also notes 3-day history of darker stools and fever to 100-101 responsive to Tylenol.  Denies sick contacts.  Patient has a history of gastric ulcers previously requiring hospitalization for IDA in the setting of peptic ulcer disease. This morning, patient began to exhibit increased work of breathing.  Patient felt he could have accidentally aspirated.  Patient has visible increased work of breathing on HFNC, and reportedly had episodes of emesis x 2 since arriving to the emergency department.  O: BP 96/65   Pulse (!) 107   Temp 98.2 F (36.8 C) (Axillary)   Resp (!) 23   Wt 27.4 kg   SpO2 98%   BMI 31.07 kg/m   Constitutional: Acutely ill appearing 48 year old male with body habitus characteristic of osteogenesis imperfecta on high flow nasal cannula, intermittently attended to interview, waxing and waning attention Cardiovascular: Tachycardic, regular rhythm, no murmurs rubs or gallops Respiratory: Tachypneic, notable increased work of breathing at rest and when speaking, subcostal and intercostal retractions, generalized rhonchi and expiratory wheezes in anterior lung fields GI: Soft, nondistended, bowel sounds present, no tenderness on mild palpation MSK: Severe bony deformity of his upper and lower extremities, foreshortening of the limbs Neuro: Grossly normal in context of OI, though unable to fully assess due to patient's mental status Psych: Cooperative, answering with yes/no answers, flat affect  A/P: Stanley [name] is a 48 y.o. male with a history of osteogenesis imperfecta and  IDA due to PUD presenting with 2 to 3-day history of dark stools and a 1 day history of intractable vomiting and subsequent respiratory distress.  Aspiration pneumonitis/pneumonia in setting of presumed upper GI bleed Likely etiology of respiratory distress given history, exam, chest x-ray.  Could have also had development of pneumonia prior to likely aspiration which is compounding the picture.  Patient mental status waxing and waning throughout conversation; previous VBG with low CO2 though stat VBG ordered.  Patient demonstrates worrisome increased work of breathing on HFNC - subcostal and intercostal retractions, belly breathing.  Patient is full code and amenable to chest compression and intubation - medical guardian (father) agrees.  Consulted CCM team, who agreed intubation was best choice to secure airway given BiPAP would be more dangerous in light of his persistent emesis. - Patient will be admitted to ICU for intubation in setting of respiratory distress with risk of further aspiration; greatly appreciate their prompt care of this patient.  Can transfer to [facility] Medicine Teaching Service once stabilized from respiratory perspective. - S/p IV Zofran 4 mg x1 for nausea - Will likely need GI consult once stabilized for endoscopy/colonoscopy given his history and reports of dark emesis/stools, though hemoglobin is reassuring - Gastric occult blood pending - Consider adding antibiotics for pneumonia  Tomie China, MD 08/30/2023, 2:23 AM PGY-1, [facility] Service pager [phone]   I agree with the assessment and plan as documented above.  Janeal Holmes, MD PGY-2, [facility]

## 2023-08-30 NOTE — Progress Notes (Signed)
 Received patient from ED intubated with a size 7 ETT @ 19 , patient was placed on current setting PRVC 320 22+5 65% , tolerating well at this time RT will continue to monitor.

## 2023-08-30 NOTE — Progress Notes (Signed)
 eLink Physician-Brief Progress Note Patient Name: Stanley [name] DOB: [birth year] MRN: [id number]   Date of Service  08/30/2023  HPI/Events of Note  47/M transferred from an outside facility for evaluation of melena. Pt was in respiratory distress in the ED with audible wheezing.  Pt was placed on high flow nasal cannula but with 3 episodes of vomiting, there was high concern for aspiration, prompting intubation.   BP 93/70, HR 130s, RR 22, O2 sats 100%  eICU Interventions  Continue empiric antibiotics for probable aspiration.  Hold heparin for DVT Prophylaxis given melena.  SCDs only for DVT prophylaxis.  Increase protonix to 40mg  IV BID.       Intervention Category Evaluation Type: New Patient Evaluation  Larinda Buttery 08/30/2023, 4:59 AM

## 2023-08-30 NOTE — Progress Notes (Signed)
 E.T tube was pulled back from 22cm to 19cm at the lip. Breath sounds equal and CCM said its in good position.

## 2023-08-30 NOTE — ED Notes (Signed)
PCCM at bedside. °

## 2023-08-31 DIAGNOSIS — K920 Hematemesis: Secondary | ICD-10-CM

## 2023-08-31 DIAGNOSIS — J189 Pneumonia, unspecified organism: Secondary | ICD-10-CM

## 2023-08-31 DIAGNOSIS — R579 Shock, unspecified: Secondary | ICD-10-CM | POA: Diagnosis not present

## 2023-08-31 DIAGNOSIS — J690 Pneumonitis due to inhalation of food and vomit: Secondary | ICD-10-CM | POA: Diagnosis not present

## 2023-08-31 MED ORDER — ORAL CARE MOUTH RINSE: 15.0000 mL | OROMUCOSAL | Status: DC

## 2023-08-31 MED ORDER — ORAL CARE MOUTH RINSE: 15.0000 mL | OROMUCOSAL | Status: DC | PRN

## 2023-08-31 MED ORDER — ESCITALOPRAM OXALATE 20 MG PO TABS
20.0000 mg | ORAL_TABLET | Freq: Every day | ORAL | Status: DC
  Administered 2023-08-31 – 2023-09-01 (×2): 20 mg via ORAL
  Filled 2023-08-31 (×2): qty 2

## 2023-08-31 MED ORDER — ACETAMINOPHEN 325 MG PO TABS
325.0000 mg | ORAL_TABLET | Freq: Four times a day (QID) | ORAL | Status: DC | PRN
  Administered 2023-08-31 – 2023-09-01 (×2): 325 mg via ORAL
  Filled 2023-08-31 (×2): qty 1

## 2023-08-31 MED ORDER — SODIUM CHLORIDE 0.9 % IV BOLUS
250.0000 mL | Freq: Once | INTRAVENOUS | Status: AC
  Administered 2023-08-31: 250 mL via INTRAVENOUS

## 2023-08-31 MED ORDER — DOCUSATE SODIUM 100 MG PO CAPS
100.0000 mg | ORAL_CAPSULE | Freq: Two times a day (BID) | ORAL | Status: DC
  Administered 2023-08-31: 100 mg via ORAL
  Filled 2023-08-31 (×3): qty 1

## 2023-08-31 MED ORDER — ARIPIPRAZOLE 5 MG PO TABS
15.0000 mg | ORAL_TABLET | Freq: Every day | ORAL | Status: DC
  Administered 2023-08-31 – 2023-09-01 (×2): 15 mg via ORAL
  Filled 2023-08-31 (×3): qty 1

## 2023-08-31 MED ORDER — POLYETHYLENE GLYCOL 3350 17 G PO PACK
17.0000 g | PACK | Freq: Every day | ORAL | Status: DC
  Filled 2023-08-31: qty 1

## 2023-08-31 MED ORDER — METOPROLOL TARTRATE 5 MG/5ML IV SOLN
2.5000 mg | Freq: Once | INTRAVENOUS | Status: AC
  Administered 2023-08-31: 2.5 mg via INTRAVENOUS
  Filled 2023-08-31: qty 5

## 2023-08-31 MED ORDER — METHYLPHENIDATE HCL ER (OSM) 18 MG PO TBCR
36.0000 mg | EXTENDED_RELEASE_TABLET | Freq: Every day | ORAL | Status: DC
Start: 2023-08-31 — End: 2023-09-02
  Administered 2023-08-31 – 2023-09-01 (×2): 36 mg via ORAL
  Filled 2023-08-31 (×2): qty 2

## 2023-08-31 NOTE — Procedures (Signed)
 Extubation Procedure Note  Patient Details:   Name: Stanley [name] DOB: 08-03-1975 MRN: [id number]   Airway Documentation:    Vent end date: 08/31/23 Vent end time: 0837   Evaluation  O2 sats: transiently fell during during procedure. Initially placed on 6L but now on a 10L [place]. Complications: No apparent complications Patient did tolerate procedure well. Bilateral Breath Sounds: Clear, Diminished   Yes  Patient was extubated to a 6L [place] but increased to a 10L [place] due to decreased sats. Positive cuff leak prior to extubation.   Carlynn Spry 08/31/2023, 8:37 AM

## 2023-08-31 NOTE — Progress Notes (Signed)
 1630- HR 130-140s, Pt reports feeing "fine" Celine Mans, [place] aware, Bolus ordered.   1710-HR still elevated, HR up to 150s and patient reports feeling "fluttery" [place], [place] aware, metoprolol ordered, after administration Pt reports feeling "much better"   1820-HR remains in 130s. Per Celine Mans, PA, permitting higher HR as long as Pt is asystematic, HR <140s with stable BP

## 2023-08-31 NOTE — Progress Notes (Signed)
 Patient placed back on PRVC 320 22 +5 40 to rest , RT will continue to monitor

## 2023-08-31 NOTE — Progress Notes (Addendum)
 PCCM Brief Note  HR up from 110 - 140s, sinus tach.  Off neo for last 1 hour. Hasn't eaten much yet.  Will give small 250cc NS bolus incase he is somewhat intravascularly dry (though noted +2.7L positive since admission).   Addendum 1715:  HR up to 150s - 160s. Pt appears comfortable, able to speak comfortably.   Will order 2.5mg  Lopressor.   Rutherford Guys, PA - [facility] For pager details, please see AMION or use Epic chat  After 1900, please call [facility] for cross coverage needs 08/31/2023, 4:36 PM

## 2023-08-31 NOTE — Progress Notes (Signed)
 1100- Okay to wean Neo by .1-.86mcg as long as patient tolerates per Dr. Denese Killings.

## 2023-08-31 NOTE — Progress Notes (Addendum)
 NAME:  Stanley [name], MRN:  [id number], DOB:  [birth year], LOS: 1 ADMISSION DATE:  08/29/2023, CONSULTATION DATE:  08/30/2023 REFERRING MD:  Gasper Lloyd CHIEF COMPLAINT:  SOB   History of Present Illness:  48 year old gentleman presents to the ED with complaints of shortness of breath, fever and emesis x 2-3 days.  Patient has had abdominal pain as well as dark stools.  Patient presents with his father stating that he has been wheezing with discomfort.  Per family medicine patient has been evaluated for upper GI bleed with a 3-day history of dark stools as well as low-grade fevers that have been responsive to Tylenol.  Patient lives in assisted living with caretaker.  In the ED patient was seen significant labored breathing with audible expiratory wheeze with respiratory rate of 24, currently on high flow nasal cannula O2 saturation is 96%.  There is noticeable accessory muscle abdominal usage.  Nurse reports 3 episodes of emesis thus far, she is in the process of administering Zosyn IV and a nebulizer treatment.  PCCM called to assess current respiratory needs.  Patient is currently on high flow nasal cannula 50/35, however secondary to concern for aspiration we are recommending endotracheal intubation as BiPAP may increased risk for aspiration considering current emesis.    Pt subsequently intubated by anesthesia 353AM 2/25.   Per mother and father, he has had PUD in the past requiring blood transfusions at [facility] though last occurrence was probably 15ish years ago. He has not had any problems since then.   Pertinent  Medical History  Osteogenesis imperfecta, benign hypertension, peptic ulcer, major depressive disorder  Significant Hospital Events: Including procedures, antibiotic start and stop dates in addition to other pertinent events   2/25 admit, intubated 2/26 extubated  Interim History / Subjective:  On low dose neo. Extubated this AM.  Objective   Blood pressure (!)  105/55, pulse (!) 118, temperature (!) 101.8 F (38.8 C), temperature source Axillary, resp. rate (!) 22, height 3\' 1"  (0.94 m), weight 31 kg, SpO2 90%.    Vent Mode: PRVC FiO2 (%):  [40 %] 40 % Set Rate:  [22 bmp] 22 bmp Vt Set:  [320 mL] 320 mL PEEP:  [5 cmH20] 5 cmH20 Pressure Support:  [10 cmH20] 10 cmH20 Plateau Pressure:  [18 cmH20] 18 cmH20   Intake/Output Summary (Last 24 hours) at 08/31/2023 1041 Last data filed at 08/31/2023 0800 Gross per 24 hour  Intake 3354.14 ml  Output 1500 ml  Net 1854.14 ml   Filed Weights   08/29/23 2133 08/30/23 0500 08/31/23 0500  Weight: 27.4 kg 30.2 kg 31 kg    Physical exam General: Adult male with osteogenesis imperfecta, in NAD. Neuro: Awake, follows commands. HEENT: [place]/AT. Sclerae anicteric. Cardiovascular: RRR, no M/R/G.  Lungs: Respirations even and unlabored.  CTA bilaterally, No W/R/R. Abdomen: BS x 4, soft, NT/ND.  Musculoskeletal: Deformities of UE and LE's 2/2 osteogenesis imperfecta. no edema.  Skin: Intact, warm, no rashes.    Assessment & Plan:   Acute hypoxic respiratory failure requiring intubation - 2/2 aspiration PNA. Successful extubation AM 2/26. Aspiration PNA. - Continue supplemental O2 as needed to maintain SpO2 > 92%. - Continue Zosyn. - Follow cultures. - Bronchial hygiene. - CXR intermittently.  Heme positive stools and emesis - of note, has hx of PUD that have not been intervened on due to body habitus per mother/father and seems to be controlled on Omeprazole. No further events since admission. - PPI (peds dosing due to weight). -  Hold on GI consult for now as output seems to have stopped/resolved and Hgb is stable at 10.8 on 2/25 (awaiting labs 2/26). - Monitor output. - Continue Zofran.  Shock - 2/2 aspiration and was likely exacerbated by positive pressure from vent with small body habitus. - Continue neo for goal MAP > 65 (weight based dosing). - Continue LR 50/hr. - Push oral diet/intake.  Hx  HTN. - Hold PTA Amlodipine, Lopressor.  Hx Aspergers, Depression, Osteogenesis Imperfecta, Psychosis. - Continue PTA Aripiprazole, Escitalopram, Methylphenidate.   If can get off neo this morning, will watch in ICU to ensure remains stable and can transfer out later this afternoon and will ask TRH to assume care in AM 2/27.   Best Practice (right click and "Reselect all SmartList Selections" daily)   Diet/type: Regular consistency (see orders) DVT prophylaxis other - no chemoprophylaxis given heme positive stools and no SCD's due to body habitus. Pressure ulcer(s): pressure ulcer assessment deferred  GI prophylaxis: PPI Lines: N/A peripheral IV x 2 Foley:  N/A Code Status:  full code Last date of multidisciplinary goals of care discussion: Discussed code status with mother and father 2/25. Strongly recommended DNR in the event of arrest due to concern for causing more harm then good with complications such as rib fx, internal bleeding etc (due to body habitus and hx osteogenesis imperfecta). Mother and father state that pt has informed them of wishes for resuscitation in the past; however, he might not be aware of what that entails. They are going to discuss things further and will also have a conversation with pt once extubated. They are in agreement to NOT perform CPR but would like to discuss with pt prior to changing code status officially.  Critical care time: 30 min.    Rutherford Guys, PA - [facility] For pager details, please see AMION or use Epic chat  After 1900, please call [facility] for cross coverage needs 08/31/2023, 10:41 AM

## 2023-08-31 NOTE — Progress Notes (Signed)
 0820[name] appears very anxious and uncomfortable at this time, keeps pointing/touching breathing tube, appearing to indicate he wants it removed. Rutherford Guys, PA made aware, and at bedside to assess. Instructed to extubate patient, RT called and en route.

## 2023-09-01 DIAGNOSIS — I10 Essential (primary) hypertension: Secondary | ICD-10-CM | POA: Diagnosis not present

## 2023-09-01 DIAGNOSIS — J189 Pneumonia, unspecified organism: Secondary | ICD-10-CM | POA: Diagnosis not present

## 2023-09-01 DIAGNOSIS — J9601 Acute respiratory failure with hypoxia: Secondary | ICD-10-CM | POA: Diagnosis not present

## 2023-09-01 LAB — BASIC METABOLIC PANEL
Anion gap: 7 (ref 5–15)
BUN: 5 mg/dL — ABNORMAL LOW (ref 6–20)
CO2: 33 mmol/L — ABNORMAL HIGH (ref 22–32)
Calcium: 7.9 mg/dL — ABNORMAL LOW (ref 8.9–10.3)
Chloride: 101 mmol/L (ref 98–111)
Creatinine, Ser: <0.3 mg/dL — ABNORMAL LOW (ref 0.61–1.24)
Glucose, Bld: 97 mg/dL (ref 70–99)
Potassium: 3.2 mmol/L — ABNORMAL LOW (ref 3.5–5.1)
Sodium: 141 mmol/L (ref 135–145)

## 2023-09-01 LAB — CBC
HCT: 27.6 % — ABNORMAL LOW (ref 39.0–52.0)
Hemoglobin: 9.1 g/dL — ABNORMAL LOW (ref 13.0–17.0)
MCH: 31 pg (ref 26.0–34.0)
MCHC: 33 g/dL (ref 30.0–36.0)
MCV: 93.9 fL (ref 80.0–100.0)
Platelets: 97 10*3/uL — ABNORMAL LOW (ref 150–400)
RBC: 2.94 MIL/uL — ABNORMAL LOW (ref 4.22–5.81)
RDW: 14 % (ref 11.5–15.5)
WBC: 5.3 10*3/uL (ref 4.0–10.5)
nRBC: 0 % (ref 0.0–0.2)

## 2023-09-01 LAB — CULTURE, RESPIRATORY W GRAM STAIN

## 2023-09-01 LAB — MAGNESIUM: Magnesium: 1.6 mg/dL — ABNORMAL LOW (ref 1.7–2.4)

## 2023-09-01 LAB — PHOSPHORUS: Phosphorus: 1.4 mg/dL — ABNORMAL LOW (ref 2.5–4.6)

## 2023-09-01 MED ORDER — POTASSIUM PHOSPHATES 15 MMOLE/5ML IV SOLN
20.0000 mmol | Freq: Once | INTRAVENOUS | Status: AC
  Administered 2023-09-01: 20 mmol via INTRAVENOUS
  Filled 2023-09-01: qty 6.67

## 2023-09-01 MED ORDER — METOPROLOL TARTRATE 12.5 MG HALF TABLET
12.5000 mg | ORAL_TABLET | Freq: Two times a day (BID) | ORAL | Status: DC
  Administered 2023-09-01 (×2): 12.5 mg via ORAL
  Filled 2023-09-01 (×2): qty 1

## 2023-09-01 MED ORDER — METOPROLOL TARTRATE 5 MG/5ML IV SOLN
2.5000 mg | Freq: Once | INTRAVENOUS | Status: AC
  Administered 2023-09-01: 2.5 mg via INTRAVENOUS
  Filled 2023-09-01: qty 5

## 2023-09-01 MED ORDER — POTASSIUM CHLORIDE CRYS ER 20 MEQ PO TBCR
20.0000 meq | EXTENDED_RELEASE_TABLET | Freq: Once | ORAL | Status: AC
  Administered 2023-09-01: 20 meq via ORAL
  Filled 2023-09-01: qty 1

## 2023-09-01 MED ORDER — CEFTRIAXONE SODIUM 2 G IJ SOLR
2.0000 g | INTRAMUSCULAR | Status: DC
  Administered 2023-09-01: 2 g via INTRAVENOUS
  Filled 2023-09-01: qty 20

## 2023-09-01 MED ORDER — MAGNESIUM SULFATE 2 GM/50ML IV SOLN
2.0000 g | Freq: Once | INTRAVENOUS | Status: AC
  Administered 2023-09-01: 2 g via INTRAVENOUS
  Filled 2023-09-01: qty 50

## 2023-09-01 MED ORDER — SODIUM CHLORIDE 0.9 % IV BOLUS
250.0000 mL | Freq: Once | INTRAVENOUS | Status: AC
  Administered 2023-09-01: 250 mL via INTRAVENOUS

## 2023-09-01 NOTE — Progress Notes (Signed)
 NAME:  Stanley [name], MRN:  [id number], DOB:  [birth year], LOS: 2 ADMISSION DATE:  08/29/2023, CONSULTATION DATE:  08/30/2023 REFERRING MD:  Gasper Lloyd CHIEF COMPLAINT:  SOB   History of Present Illness:  48 year old gentleman presents to the ED with complaints of shortness of breath, fever and emesis x 2-3 days.  Patient has had abdominal pain as well as dark stools.  Patient presents with his father stating that he has been wheezing with discomfort.  Per family medicine patient has been evaluated for upper GI bleed with a 3-day history of dark stools as well as low-grade fevers that have been responsive to Tylenol.  Patient lives in assisted living with caretaker.  In the ED patient was seen significant labored breathing with audible expiratory wheeze with respiratory rate of 24, currently on high flow nasal cannula O2 saturation is 96%.  There is noticeable accessory muscle abdominal usage.  Nurse reports 3 episodes of emesis thus far, she is in the process of administering Zosyn IV and a nebulizer treatment.  PCCM called to assess current respiratory needs.  Patient is currently on high flow nasal cannula 50/35, however secondary to concern for aspiration we are recommending endotracheal intubation as BiPAP may increased risk for aspiration considering current emesis.    Pt subsequently intubated by anesthesia 353AM 2/25.   Per mother and father, he has had PUD in the past requiring blood transfusions at [facility] though last occurrence was probably 15ish years ago. He has not had any problems since then.   Pertinent  Medical History  Osteogenesis imperfecta, benign hypertension, peptic ulcer, major depressive disorder  Significant Hospital Events: Including procedures, antibiotic start and stop dates in addition to other pertinent events   2/25: admit, intubated 2/26: extubated 2/27: add home metoprolol back, transfer out   Interim History / Subjective:  NAEON. Feels good this  morning.   Objective   Blood pressure (!) 122/91, pulse (!) 120, temperature 98.9 F (37.2 C), temperature source Oral, resp. rate (!) 23, height 3\' 1"  (0.94 m), weight 31 kg, SpO2 98%.        Intake/Output Summary (Last 24 hours) at 09/01/2023 0947 Last data filed at 09/01/2023 0857 Gross per 24 hour  Intake 2380.91 ml  Output 2250 ml  Net 130.91 ml   Filed Weights   08/30/23 0500 08/31/23 0500 09/01/23 0500  Weight: 30.2 kg 31 kg 31 kg    Physical exam General: Adult male with osteogenesis imperfecta, in NAD. Neuro: Awake, follows commands. HEENT: /AT. Sclerae anicteric. Cardiovascular: RRR, no M/R/G.  Lungs: Respirations even and unlabored.  CTA bilaterally, No W/R/R. Abdomen: BS x 4, soft, NT/ND.  Musculoskeletal: Deformities of UE and LE's 2/2 osteogenesis imperfecta. no edema.  Skin: Intact, warm, no rashes.  Assessment & Plan:   Acute hypoxic respiratory failure requiring intubation 2/2 aspiration PNA; Successful extubation AM 2/26. K. Pneumonia on resp culture  - Continue supplemental O2 as needed to maintain SpO2 > 92% - Continue Zosyn - Bronchial hygiene. - CXR intermittently.  Heme positive stools and emesis - of note, has hx of PUD that have not been intervened on due to body habitus per mother/father and seems to be controlled on Omeprazole. No further events since admission. - PPI (peds dosing due to weight). - hemoglobin stable, will hold DVT ppx and restart 2/28 - Monitor output. - con't zofran  Hypertension  - restart home metoprolol 1/2 dose today 12.5 BID  - con't to hold amlodipine  Hx Aspergers, Depression, Osteogenesis  Imperfecta, Psychosis. - Continue PTA Aripiprazole, Escitalopram, Methylphenidate.  Best Practice (right click and "Reselect all SmartList Selections" daily)   Diet/type: Regular consistency (see orders) DVT prophylaxis other - no chemoprophylaxis given heme positive stools and no SCD's due to body habitus. Pressure ulcer(s):  N/A GI prophylaxis: PPI Lines: Central line and No longer needed.  Order written to d/c   Foley:  N/A Code Status:  full code Last date of multidisciplinary goals of care discussion: Discussed code status with mother and father 2/25. Strongly recommended DNR in the event of arrest due to concern for causing more harm then good with complications such as rib fx, internal bleeding etc (due to body habitus and hx osteogenesis imperfecta). Mother and father state that pt has informed them of wishes for resuscitation in the past; however, he might not be aware of what that entails. They are going to discuss things further and will also have a conversation with pt when stable  Critical care time: na   Lenard Galloway [facility] 09/01/23 1:11 PM  Please see [email or web address] for pager details.  From 7A-7P if no response, please call [phone] After hours, please call ELink [phone]

## 2023-09-01 NOTE — Hospital Course (Addendum)
 Stanley [name] is a 48 y.o. male who was admitted to the [facility] Medicine Teaching Service at [facility] for shortness of breath.  Past medical history includes osteogenesis imperfecta and IDA secondary to bleeding PUD.  Hospital course is outlined below by problem.   Aspiration pneumonia In the setting of upper GI bleed as below.  Presented with increasing respiratory distress in the absence of sick contacts.  Chest x-ray demonstrated pneumonia.  He continued to have increased work of breathing on high flow nasal cannula; PCCM was consulted who intubated patient for airway protection in the setting of bloody emesis.  Dark emesis material was suctioned from the lungs.  Pulmonary cultures collected which demonstrated***.  He was administered IV Zosyn.  He was extubated after 24 hours and placed back on nasal cannula.  Upper GI bleed Presented with dark emesis and dark stools.  KUB with moderately dilated loops of bowel without obstruction.  FOBT positive.  He was given IV Zofran as well as PPI for presumed bleeding peptic ulcers.  He was also added on IV Zosyn as above.  Once transferred to the ICU, GI was consulted***.  Shock, tachycardia After intubation, briefly required phenylephrine and supplementary fluids for shock that was felt to be due to increased positive ventilatory pressure with a small body habitus.  Once extubated, phenylephrine was stopped with good MAP.  He then went on to develop elevated heart rates into the 130s-140s for which metoprolol was started and a bolus of fluid was given.***  Other conditions that were chronic and stable: Hypertension (held home meds), Asperger's, osteogenesis imperfecta, depression/psychosis (continued aripiprazole, escitalopram, methylphenidate).  Issues for follow up: ***

## 2023-09-02 ENCOUNTER — Inpatient Hospital Stay (HOSPITAL_COMMUNITY): Payer: Medicare Other

## 2023-09-02 DIAGNOSIS — J189 Pneumonia, unspecified organism: Secondary | ICD-10-CM | POA: Diagnosis not present

## 2023-09-02 DIAGNOSIS — J9601 Acute respiratory failure with hypoxia: Secondary | ICD-10-CM | POA: Diagnosis not present

## 2023-09-02 DIAGNOSIS — J690 Pneumonitis due to inhalation of food and vomit: Secondary | ICD-10-CM | POA: Diagnosis not present

## 2023-09-02 DIAGNOSIS — R0603 Acute respiratory distress: Secondary | ICD-10-CM

## 2023-09-02 DIAGNOSIS — D649 Anemia, unspecified: Secondary | ICD-10-CM

## 2023-09-02 DIAGNOSIS — I10 Essential (primary) hypertension: Secondary | ICD-10-CM | POA: Diagnosis not present

## 2023-09-02 DIAGNOSIS — K922 Gastrointestinal hemorrhage, unspecified: Secondary | ICD-10-CM

## 2023-09-02 LAB — GLUCOSE, CAPILLARY: Glucose-Capillary: 170 mg/dL — ABNORMAL HIGH (ref 70–99)

## 2023-09-02 MED ORDER — MORPHINE SULFATE (PF) 2 MG/ML IV SOLN
2.0000 mg | INTRAVENOUS | Status: AC
  Administered 2023-09-02: 2 mg via INTRAVENOUS
  Filled 2023-09-02: qty 1

## 2023-09-02 MED ORDER — GLYCOPYRROLATE 0.2 MG/ML IJ SOLN
0.1000 mg | Freq: Once | INTRAMUSCULAR | Status: AC
  Administered 2023-09-02: 0.1 mg via INTRAVENOUS
  Filled 2023-09-02: qty 1

## 2023-09-02 MED ORDER — MORPHINE SULFATE (PF) 2 MG/ML IV SOLN
2.0000 mg | INTRAVENOUS | Status: DC | PRN
  Administered 2023-09-02: 2 mg via INTRAVENOUS
  Filled 2023-09-02: qty 1

## 2023-09-02 MED ORDER — POLYVINYL ALCOHOL 1.4 % OP SOLN
1.0000 [drp] | Freq: Four times a day (QID) | OPHTHALMIC | Status: DC | PRN
  Filled 2023-09-02: qty 15

## 2023-09-02 MED ORDER — HALOPERIDOL LACTATE 5 MG/ML IJ SOLN: 2.5000 mg | INTRAMUSCULAR | Status: DC | PRN

## 2023-09-02 MED ORDER — ACETAMINOPHEN 650 MG RE SUPP
650.0000 mg | Freq: Four times a day (QID) | RECTAL | Status: DC | PRN
Start: 2023-09-02 — End: 2023-09-02

## 2023-09-02 MED ORDER — LORAZEPAM 2 MG/ML IJ SOLN
1.0000 mg | INTRAMUSCULAR | Status: AC
  Administered 2023-09-02: 1 mg via INTRAVENOUS
  Filled 2023-09-02: qty 1

## 2023-09-02 MED ORDER — GLYCOPYRROLATE 0.2 MG/ML IJ SOLN: 0.2000 mg | INTRAMUSCULAR | Status: DC | PRN

## 2023-09-02 MED ORDER — MORPHINE SULFATE (PF) 2 MG/ML IV SOLN
1.0000 mg | Freq: Once | INTRAVENOUS | Status: AC
  Administered 2023-09-02: 4 mg via INTRAVENOUS
  Filled 2023-09-02: qty 2

## 2023-09-02 MED ORDER — MORPHINE SULFATE (PF) 2 MG/ML IV SOLN: 2.0000 mg | INTRAVENOUS | Status: DC

## 2023-09-02 MED ORDER — GLYCOPYRROLATE 1 MG PO TABS: 1.0000 mg | ORAL_TABLET | ORAL | Status: DC | PRN

## 2023-09-02 MED ORDER — LORAZEPAM 2 MG/ML IJ SOLN
2.0000 mg | INTRAMUSCULAR | Status: DC | PRN
  Administered 2023-09-02: 4 mg via INTRAVENOUS
  Filled 2023-09-02: qty 2

## 2023-09-02 MED ORDER — ACETAMINOPHEN 325 MG PO TABS: 650.0000 mg | ORAL_TABLET | Freq: Four times a day (QID) | ORAL | Status: DC | PRN

## 2023-09-03 NOTE — Progress Notes (Signed)
 Received call from family of Stanley [name], who went to the ICU looking for the patient. I directed them to [address] [place], they requested to speak with Dr. Allena Katz, he has been paged and is en-route to the bedside.

## 2023-09-03 NOTE — Progress Notes (Signed)
     Daily Progress Note Intern Pager: [phone]  Patient name: Stanley [name] Medical record number: [id number] Date of birth: 1975/08/30 Age: 48 y.o. Gender: male  Primary Care Provider: Doreene Eland, MD Consultants: CCM Code Status: DNR/DNI  Pt Overview and Major Events to Date:  2/24: Admitted to ICU, intubated 2/26: Extubated  Assessment and Plan: Patient is a 48 year old male who presented to the emergency department for shortness of breath fevers and emesis as well as dark stools.  In the ED patient had significant labored breathing which ultimately required intubation by CCM on 2/25.  Patient's acute hypoxic respiratory failure requiring intubation likely secondary to aspiration pneumonia, treated with supplemental oxygen and antibiotics.  Patient was successfully extubated on 2/26.  Continued to need supplemental O2.  Given history of PUD and concerns of dark stools, FOBT done which was positive.  Given patient's output stopped and hemoglobin was stable, did not consult GI.  On 2/28 Pm, patient found to have labored breathing and was lethargic. CCM providers discussed prognosis with family, who changed CODE STATUS to DNR/DNI.  This morning, had discussion with family who requested patient be transitioned to comfort care once family has arrived.  Will transition to comfort care.  Assessment & Plan Acute hypoxic respiratory failure (HCC) Acute hypoxic respiratory failure requiring intubation likely secondary to klebsiella pneumonia.  Patient is now extubated. Will transition patient to comfort care.  - will change code status to comfort care - morphine 1-4 mg every 15 min prn  - robinul prn - comfort care orders in   Chronic and Stable Issues: HTN: holding medication in setting of comfort care  Osteogenesis Imperfecta: holding medication in setting of comfort care  Depression: holding medication in setting of comfort care  HLD: holding medication in setting of comfort care    FEN/GI: NPO PPx: none  Dispo:Comfort care   Subjective:  Family seen at bedside. Appropriately tearful. Patient on HFNC. Not responsive   Objective: Temp:  [98 F (36.7 C)-100.5 F (38.1 C)] 99.8 F (37.7 C) (02/27 2155) Pulse Rate:  [118-138] 131 (02/28 0500) Resp:  [15-35] 35 (02/28 0500) BP: (91-137)/(63-109) 133/91 (02/28 0500) SpO2:  [71 %-100 %] 84 % (02/28 0500) FiO2 (%):  [80 %-100 %] 100 % (02/28 0504) Physical Exam: General: does not appear in pain, eyes closed, not responding Respiratory: bilateral chest rise, mildly tachypneic on HFNC  Laboratory: Most recent CBC Lab Results  Component Value Date   WBC 5.3 09/01/2023   HGB 9.1 (L) 09/01/2023   HCT 27.6 (L) 09/01/2023   MCV 93.9 09/01/2023   PLT 97 (L) 09/01/2023   Most recent BMP    Latest Ref Rng & Units 09/01/2023    4:35 AM  BMP  Glucose 70 - 99 mg/dL 97   BUN 6 - 20 mg/dL 5   Creatinine 1.47 - 8.29 mg/dL <5.62   Sodium 130 - 865 mmol/L 141   Potassium 3.5 - 5.1 mmol/L 3.2   Chloride 98 - 111 mmol/L 101   CO2 22 - 32 mmol/L 33   Calcium 8.9 - 10.3 mg/dL 7.9     Imaging/Diagnostic Tests: No new images  Penne Lash, MD 08/15/2023, 8:02 AM  PGY-1, [facility] FPTS Intern pager: [phone], text pages welcome Secure chat group [facility] Teaching Service

## 2023-09-03 NOTE — Significant Event (Signed)
 Rapid Response Event Note   Reason for Call :  Resp Distress with spO2 85%  Initial Focused Assessment:  On arrival, pt laying in bed with labored breathing. Accessory muscle use noted with coarse breathe sounds on the right and inspiratory and expiratory wheezing noted greater on the left side. Pt lethargic but does respond to physical stimuli.   VS: HR 117, BP 107/64, RR 36, spO2 93% on 8L HFNC   Interventions:  CXR Albuterol CCM to bedside Family notified and changed code status to DNR/DNI CCM wants HHFNC 40% placed PCU 2mg  Morphine  Plan of Care:  Continue to monitor pt status. Transfer to PCU. RN to call with any questions or concerns.    Event Summary:   MD Notified: E-link Call Time: 0117 Arrival Time: 0121 End Time: 6295  Mordecai Rasmussen, RN

## 2023-09-03 NOTE — Death Summary Note (Signed)
 Family Medicine Teaching [facility] Death Summary  Patient name: Stanley [name] Medical record number: [id number] Date of birth: 04/09/1976 Age: 48 y.o. Gender: male Date of Admission: 2023-09-25  Date of Death: 09-29-23  Admitting Physician: Madaline Brilliant, DO  Primary Care Provider: Doreene Eland, MD Consultants: CCM, Chaplain   Indication for Hospitalization: Acute Respiratory Distress   Discharge Diagnoses/Problem List:  Principal Problem:   CAP (community acquired pneumonia) Active Problems:   Respiratory distress   Aspiration pneumonitis (HCC)   Acute hypoxic respiratory failure (HCC)   Acute anemia   Gastrointestinal hemorrhage    Disposition: Death  Brief Hospital Course:  Stanley [name] is a 48 y.o. male who was admitted to the [facility] Medicine Teaching Service at [facility] for shortness of breath.  Past medical history includes osteogenesis imperfecta and IDA secondary to bleeding PUD.  Hospital course is outlined below by problem.   Aspiration pneumonia In the setting of upper GI bleed as below.  Presented with increasing respiratory distress in the absence of sick contacts.  Chest x-ray demonstrated pneumonia.  He continued to have increased work of breathing on high flow nasal cannula; PCCM was consulted who intubated patient for airway protection in the setting of bloody emesis.  Dark emesis material was suctioned from the lungs.  Pulmonary cultures collected which demonstrated rare klebsiella pneumoniae. He was administered IV Zosyn.  He was extubated after 24 hours and placed back on nasal cannula. On 09/29/2023, patient experienced respiratory distress with hypoxia with CXR findings of worsening pulmonary edema. Given worsening status and grim prognosis, patient and family elected for comfort care. Patient was pronounced deceased by nursing on 29-Sep-2023 at 1300  Upper GI bleed Presented with dark emesis and dark stools.  KUB with moderately dilated loops of bowel  without obstruction.  FOBT positive.  He was given IV Zofran as well as PPI for presumed bleeding peptic ulcers.  He was also added on IV Zosyn as above.  Unfortunately, respiratory status worsened in the ICU, and patient was placed on comfort care.  Shock, tachycardia After intubation, briefly required phenylephrine and supplementary fluids for shock that was felt to be due to increased positive ventilatory pressure with a small body habitus.  Once extubated, phenylephrine was stopped with good MAP.  He then went on to develop elevated heart rates into the 130s-140s for which metoprolol was started and a bolus of fluid was given. He remained tachycardic, though shock improved.  Unfortunately, respiratory status worsened in the ICU, and patient was placed on comfort care.  Other conditions that were chronic and stable: Hypertension (held home amlodipine but continued metoprolol as above), Asperger's, osteogenesis imperfecta, depression/psychosis (continued aripiprazole, escitalopram, methylphenidate).    Significant Procedures: Intubation   Significant Labs and Imaging:  Recent Labs  Lab 2023-09-25 2220 08/30/23 0030 08/30/23 0031 08/30/23 0928 09/01/23 0435  WBC 7.4  --   --  6.0 5.3  HGB 15.1   < > 12.9* 10.8* 9.1*  HCT 45.5   < > 38.0* 32.2* 27.6*  PLT 191  --   --  144* 97*   < > = values in this interval not displayed.   Recent Labs  Lab September 25, 2023 2220 08/30/23 0030 08/30/23 0031 08/30/23 0538 09/01/23 0435  NA 134* 139 138 136 141  K 4.3 3.7 3.7 3.6 3.2*  CL 102  --  107 108 101  CO2 23  --   --  22 33*  GLUCOSE 139*  --  121* 170* 97  BUN  29*  --  27* 42* 5*  CREATININE 0.36*  --  0.30* 0.44* <0.30*  CALCIUM 8.6*  --   --  7.5* 7.9*  MG  --   --   --   --  1.6*  PHOS  --   --   --   --  1.4*  ALKPHOS 45  --   --   --   --   AST 27  --   --   --   --   ALT 19  --   --   --   --   ALBUMIN 2.9*  --   --   --   --     Chest x ray 2/28: Stable airspace opacities  bilaterally   Abd Xray 2/25: Nonobstructive bowel-gas pattern.   Georg Ruddle Marty Uy, MD 08/26/2023, 1:22 PM PGY-1, [facility]

## 2023-09-03 NOTE — Patient Outreach (Addendum)
 FMTS PCP NOTE Nicolette Bang  I visited the patient's bedside to check on him, where his parent and daughter, Pedro Earls, were present. Acknowledging his change in condition, I offered support to the best of my ability. I am grateful for the compassionate care the ICU and 4E team provided.

## 2023-09-03 NOTE — Progress Notes (Signed)
 RT and RN tried probe on different locations but not getting accurate sat. Pt currently on HHF 40l 80% sat of 65-71 but pt is alert and oriented. Pt was on 10l [place]ter while admitted on 2W sating 93%. Will continue to monitor.

## 2023-09-03 NOTE — Progress Notes (Signed)
   08/16/2023 0500  Vitals  BP (!) 133/91  MAP (mmHg) 104  Pulse Rate (!) 131  ECG Heart Rate (!) 127  Resp (!) 35  MEWS COLOR  MEWS Score Color Red  Oxygen Therapy  SpO2 (!) 84 %  MEWS Score  MEWS Temp 0  MEWS Systolic 0  MEWS Pulse 2  MEWS RR 2  MEWS LOC 0  MEWS Score 4   Patient received from [facility] transfer, this is a critical care transfer with receive code status change to DNR/DNI. Unable to establish reliable pulse oximetry reading after placing probes on multiple sites. Patient responds to voice.  Will continue to closely monitor.

## 2023-09-03 NOTE — Progress Notes (Addendum)
 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 Patient extubated on 08/31/23.  RRT notified Intensivist team on call on 08/27/2023 around 2:15AM that patient was in respiratory distress. I evaluated patient bedside, he was tachypneic on 10L o2.  I spoke with patient's mother Wyline Copas - at [phone] - over the phone regarding patient's condition, along with his quality of life likely resulting in a reintubation and potential tracheostomy with long term care, unfortunately a poor quality of life.  Sue Lush understood the situation, and wished to make the patient a DNR/DNI at this time and resume medical management. They plan to come bedside, and realize patient is nearing a potential end-of-life situation.  RN in room 2W33C-01 bedside during this conversation.   Patient will be moved to Progressive Care. Will resume respiratory management and provide morphine for air hunger, humidified heated high flow oxygen - at least 40 liters.   08/26/2023 Update 630AM - Patient's family bedside -both parents along with patient's adult sister, family has requested to transition patient over to comfort measures around 730 this morning once his brother arrives. - Patient given morphine and Ativan for now to help with tachypnea and air hunger. Plan to transition over to comfort measures later today. Discussed with RN bedside.  --  CRITICAL CARE Performed by: Madaline Brilliant DO PCCM   Total critical care time: 35 minutes  Critical care time was exclusive of separately billable procedures and treating other patients.  Critical care was necessary to treat or prevent imminent or life-threatening deterioration.  Critical care was time spent personally by me on the following activities: development of treatment plan with patient and/or surrogate as well as nursing, discussions with consultants, evaluation of patient's response to treatment,  examination of patient, obtaining history from patient or surrogate, ordering and performing treatments and interventions, ordering and review of laboratory studies, ordering and review of radiographic studies, pulse oximetry and re-evaluation of patient's condition.

## 2023-09-03 NOTE — Progress Notes (Signed)
 This chaplain responded to Dr. Allena Katz consult for EOL spiritual care.   The Pt. preferred name is "Stanley [name]". The Pt. is surrounded by family at the bedside. The chaplain understands the Pt. is religious and identifies with "Jesus is my pilot." The Pt. family is not religious. The Pt. family member-Antonio is reaching out to community clergy for a visit. Chaplain hospitality was accepted by the family through providing soda and water. The chaplain understands the unit ordered a comfort tray.  This chaplain is available for F/U spiritual care as needed.  Chaplain Stephanie Acre [phone] .

## 2023-09-03 NOTE — Assessment & Plan Note (Addendum)
 Acute hypoxic respiratory failure requiring intubation likely secondary to klebsiella pneumonia.  Patient is now extubated. Will transition patient to comfort care.  - will change code status to comfort care - morphine 1-4 mg every 15 min prn  - robinul prn - comfort care orders in

## 2023-09-03 NOTE — Progress Notes (Addendum)
 Patient's death time is 60.  Pt assessed.  No breath and heart sounds were auscultated. Pt also Scientist, clinical (histocompatibility and immunogenetics) notified.  MD notified.

## 2023-09-03 DEATH — deceased

## 2023-09-04 LAB — CULTURE, BLOOD (ROUTINE X 2)
Culture: NO GROWTH
Culture: NO GROWTH
